# Patient Record
Sex: Female | Born: 1980 | Race: Black or African American | Hispanic: No | Marital: Single | Smoking: Never smoker
Health system: Southern US, Community
[De-identification: ages and names within clinical notes are randomized; demographics above are authoritative.]

## PROBLEM LIST (undated history)

## (undated) DIAGNOSIS — K219 Gastro-esophageal reflux disease without esophagitis: Secondary | ICD-10-CM

## (undated) DIAGNOSIS — K2970 Gastritis, unspecified, without bleeding: Secondary | ICD-10-CM

---
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
## 1998-02-13 ENCOUNTER — Inpatient Hospital Stay (HOSPITAL_COMMUNITY): Admission: AD | Admit: 1998-02-13 | Discharge: 1998-02-13 | Payer: Self-pay | Admitting: Obstetrics

## 1998-05-23 ENCOUNTER — Encounter: Admission: RE | Admit: 1998-05-23 | Discharge: 1998-05-23 | Payer: Self-pay | Admitting: Family Medicine

## 1998-05-26 ENCOUNTER — Emergency Department (HOSPITAL_COMMUNITY): Admission: EM | Admit: 1998-05-26 | Discharge: 1998-05-26 | Payer: Self-pay | Admitting: Emergency Medicine

## 1998-05-26 ENCOUNTER — Encounter: Payer: Self-pay | Admitting: Emergency Medicine

## 1998-05-29 ENCOUNTER — Encounter: Admission: RE | Admit: 1998-05-29 | Discharge: 1998-05-29 | Payer: Self-pay | Admitting: Family Medicine

## 1998-09-23 ENCOUNTER — Encounter: Admission: RE | Admit: 1998-09-23 | Discharge: 1998-09-23 | Payer: Self-pay | Admitting: Family Medicine

## 1998-10-02 ENCOUNTER — Encounter: Admission: RE | Admit: 1998-10-02 | Discharge: 1998-10-02 | Payer: Self-pay | Admitting: Family Medicine

## 1999-01-07 ENCOUNTER — Encounter: Admission: RE | Admit: 1999-01-07 | Discharge: 1999-01-07 | Payer: Self-pay | Admitting: Family Medicine

## 1999-04-18 ENCOUNTER — Encounter: Admission: RE | Admit: 1999-04-18 | Discharge: 1999-04-18 | Payer: Self-pay | Admitting: Family Medicine

## 1999-04-22 ENCOUNTER — Encounter: Admission: RE | Admit: 1999-04-22 | Discharge: 1999-04-22 | Payer: Self-pay | Admitting: Sports Medicine

## 1999-05-01 ENCOUNTER — Encounter: Admission: RE | Admit: 1999-05-01 | Discharge: 1999-05-01 | Payer: Self-pay | Admitting: Family Medicine

## 1999-05-02 ENCOUNTER — Encounter: Admission: RE | Admit: 1999-05-02 | Discharge: 1999-05-02 | Payer: Self-pay | Admitting: Family Medicine

## 1999-05-02 ENCOUNTER — Ambulatory Visit (HOSPITAL_COMMUNITY): Admission: RE | Admit: 1999-05-02 | Discharge: 1999-05-02 | Payer: Self-pay | Admitting: Family Medicine

## 1999-05-05 ENCOUNTER — Ambulatory Visit (HOSPITAL_COMMUNITY): Admission: RE | Admit: 1999-05-05 | Discharge: 1999-05-05 | Payer: Self-pay | Admitting: Family Medicine

## 1999-05-22 ENCOUNTER — Encounter: Admission: RE | Admit: 1999-05-22 | Discharge: 1999-05-22 | Payer: Self-pay | Admitting: Family Medicine

## 1999-05-30 ENCOUNTER — Encounter: Admission: RE | Admit: 1999-05-30 | Discharge: 1999-05-30 | Payer: Self-pay | Admitting: Family Medicine

## 1999-05-30 ENCOUNTER — Other Ambulatory Visit: Admission: RE | Admit: 1999-05-30 | Discharge: 1999-05-30 | Payer: Self-pay | Admitting: Obstetrics & Gynecology

## 1999-08-02 ENCOUNTER — Emergency Department (HOSPITAL_COMMUNITY): Admission: EM | Admit: 1999-08-02 | Discharge: 1999-08-03 | Payer: Self-pay | Admitting: Emergency Medicine

## 1999-08-03 ENCOUNTER — Encounter: Payer: Self-pay | Admitting: Emergency Medicine

## 1999-09-09 ENCOUNTER — Encounter: Admission: RE | Admit: 1999-09-09 | Discharge: 1999-09-09 | Payer: Self-pay | Admitting: Sports Medicine

## 1999-10-14 ENCOUNTER — Encounter: Admission: RE | Admit: 1999-10-14 | Discharge: 1999-10-14 | Payer: Self-pay | Admitting: Family Medicine

## 1999-11-12 ENCOUNTER — Encounter: Admission: RE | Admit: 1999-11-12 | Discharge: 1999-11-12 | Payer: Self-pay | Admitting: Family Medicine

## 1999-11-14 ENCOUNTER — Encounter: Admission: RE | Admit: 1999-11-14 | Discharge: 1999-11-14 | Payer: Self-pay | Admitting: Family Medicine

## 1999-11-19 ENCOUNTER — Emergency Department (HOSPITAL_COMMUNITY): Admission: EM | Admit: 1999-11-19 | Discharge: 1999-11-20 | Payer: Self-pay | Admitting: Internal Medicine

## 1999-11-27 ENCOUNTER — Encounter: Admission: RE | Admit: 1999-11-27 | Discharge: 1999-11-27 | Payer: Self-pay | Admitting: Family Medicine

## 1999-12-24 ENCOUNTER — Encounter: Admission: RE | Admit: 1999-12-24 | Discharge: 1999-12-24 | Payer: Self-pay | Admitting: Family Medicine

## 2000-01-02 ENCOUNTER — Encounter: Admission: RE | Admit: 2000-01-02 | Discharge: 2000-01-02 | Payer: Self-pay | Admitting: Family Medicine

## 2000-03-09 ENCOUNTER — Emergency Department (HOSPITAL_COMMUNITY): Admission: EM | Admit: 2000-03-09 | Discharge: 2000-03-09 | Payer: Self-pay | Admitting: Emergency Medicine

## 2000-03-11 ENCOUNTER — Encounter: Payer: Self-pay | Admitting: Emergency Medicine

## 2000-03-11 ENCOUNTER — Emergency Department (HOSPITAL_COMMUNITY): Admission: EM | Admit: 2000-03-11 | Discharge: 2000-03-11 | Payer: Self-pay | Admitting: Emergency Medicine

## 2000-03-23 ENCOUNTER — Emergency Department (HOSPITAL_COMMUNITY): Admission: EM | Admit: 2000-03-23 | Discharge: 2000-03-23 | Payer: Self-pay | Admitting: Emergency Medicine

## 2000-03-29 ENCOUNTER — Emergency Department (HOSPITAL_COMMUNITY): Admission: EM | Admit: 2000-03-29 | Discharge: 2000-03-29 | Payer: Self-pay | Admitting: Emergency Medicine

## 2000-03-29 ENCOUNTER — Encounter: Payer: Self-pay | Admitting: Emergency Medicine

## 2000-04-01 ENCOUNTER — Ambulatory Visit (HOSPITAL_COMMUNITY): Admission: RE | Admit: 2000-04-01 | Discharge: 2000-04-01 | Payer: Self-pay | Admitting: Family Medicine

## 2000-04-01 ENCOUNTER — Encounter: Admission: RE | Admit: 2000-04-01 | Discharge: 2000-04-01 | Payer: Self-pay | Admitting: Family Medicine

## 2000-04-08 ENCOUNTER — Emergency Department (HOSPITAL_COMMUNITY): Admission: EM | Admit: 2000-04-08 | Discharge: 2000-04-09 | Payer: Self-pay | Admitting: Emergency Medicine

## 2000-04-08 ENCOUNTER — Encounter: Admission: RE | Admit: 2000-04-08 | Discharge: 2000-04-08 | Payer: Self-pay | Admitting: Family Medicine

## 2000-04-09 ENCOUNTER — Encounter: Payer: Self-pay | Admitting: Emergency Medicine

## 2000-04-12 ENCOUNTER — Ambulatory Visit (HOSPITAL_COMMUNITY): Admission: RE | Admit: 2000-04-12 | Discharge: 2000-04-12 | Payer: Self-pay | Admitting: Emergency Medicine

## 2000-04-12 ENCOUNTER — Encounter: Payer: Self-pay | Admitting: Emergency Medicine

## 2000-04-23 ENCOUNTER — Encounter: Admission: RE | Admit: 2000-04-23 | Discharge: 2000-04-23 | Payer: Self-pay | Admitting: Family Medicine

## 2000-04-23 ENCOUNTER — Emergency Department (HOSPITAL_COMMUNITY): Admission: EM | Admit: 2000-04-23 | Discharge: 2000-04-23 | Payer: Self-pay | Admitting: Emergency Medicine

## 2000-05-10 ENCOUNTER — Encounter: Payer: Self-pay | Admitting: Emergency Medicine

## 2000-05-10 ENCOUNTER — Emergency Department (HOSPITAL_COMMUNITY): Admission: EM | Admit: 2000-05-10 | Discharge: 2000-05-10 | Payer: Self-pay | Admitting: Emergency Medicine

## 2000-05-12 ENCOUNTER — Encounter: Admission: RE | Admit: 2000-05-12 | Discharge: 2000-05-12 | Payer: Self-pay | Admitting: Family Medicine

## 2000-06-23 ENCOUNTER — Emergency Department (HOSPITAL_COMMUNITY): Admission: EM | Admit: 2000-06-23 | Discharge: 2000-06-23 | Payer: Self-pay | Admitting: Emergency Medicine

## 2000-08-29 ENCOUNTER — Emergency Department (HOSPITAL_COMMUNITY): Admission: EM | Admit: 2000-08-29 | Discharge: 2000-08-29 | Payer: Self-pay | Admitting: Emergency Medicine

## 2000-09-07 ENCOUNTER — Encounter: Admission: RE | Admit: 2000-09-07 | Discharge: 2000-09-07 | Payer: Self-pay | Admitting: Family Medicine

## 2000-09-24 ENCOUNTER — Encounter: Admission: RE | Admit: 2000-09-24 | Discharge: 2000-09-24 | Payer: Self-pay | Admitting: Family Medicine

## 2000-10-05 ENCOUNTER — Encounter: Admission: RE | Admit: 2000-10-05 | Discharge: 2000-10-05 | Payer: Self-pay | Admitting: Family Medicine

## 2000-11-10 ENCOUNTER — Encounter: Admission: RE | Admit: 2000-11-10 | Discharge: 2000-11-10 | Payer: Self-pay | Admitting: Family Medicine

## 2000-11-17 ENCOUNTER — Encounter: Admission: RE | Admit: 2000-11-17 | Discharge: 2000-11-17 | Payer: Self-pay | Admitting: Family Medicine

## 2000-12-02 ENCOUNTER — Encounter: Admission: RE | Admit: 2000-12-02 | Discharge: 2000-12-02 | Payer: Self-pay | Admitting: Family Medicine

## 2001-01-31 ENCOUNTER — Encounter: Payer: Self-pay | Admitting: Emergency Medicine

## 2001-01-31 ENCOUNTER — Emergency Department (HOSPITAL_COMMUNITY): Admission: EM | Admit: 2001-01-31 | Discharge: 2001-01-31 | Payer: Self-pay | Admitting: Emergency Medicine

## 2001-02-01 ENCOUNTER — Emergency Department (HOSPITAL_COMMUNITY): Admission: EM | Admit: 2001-02-01 | Discharge: 2001-02-01 | Payer: Self-pay | Admitting: Emergency Medicine

## 2001-02-05 ENCOUNTER — Emergency Department (HOSPITAL_COMMUNITY): Admission: EM | Admit: 2001-02-05 | Discharge: 2001-02-05 | Payer: Self-pay | Admitting: Emergency Medicine

## 2001-03-18 ENCOUNTER — Emergency Department (HOSPITAL_COMMUNITY): Admission: EM | Admit: 2001-03-18 | Discharge: 2001-03-18 | Payer: Self-pay | Admitting: Emergency Medicine

## 2001-03-20 ENCOUNTER — Emergency Department (HOSPITAL_COMMUNITY): Admission: EM | Admit: 2001-03-20 | Discharge: 2001-03-20 | Payer: Self-pay | Admitting: Emergency Medicine

## 2001-04-21 ENCOUNTER — Encounter: Admission: RE | Admit: 2001-04-21 | Discharge: 2001-04-21 | Payer: Self-pay | Admitting: Family Medicine

## 2001-04-21 ENCOUNTER — Ambulatory Visit (HOSPITAL_COMMUNITY): Admission: RE | Admit: 2001-04-21 | Discharge: 2001-04-21 | Payer: Self-pay | Admitting: Family Medicine

## 2001-04-28 ENCOUNTER — Encounter: Admission: RE | Admit: 2001-04-28 | Discharge: 2001-04-28 | Payer: Self-pay | Admitting: Family Medicine

## 2001-05-05 ENCOUNTER — Emergency Department (HOSPITAL_COMMUNITY): Admission: EM | Admit: 2001-05-05 | Discharge: 2001-05-05 | Payer: Self-pay | Admitting: Emergency Medicine

## 2001-05-20 ENCOUNTER — Encounter: Admission: RE | Admit: 2001-05-20 | Discharge: 2001-05-20 | Payer: Self-pay | Admitting: Family Medicine

## 2001-07-17 ENCOUNTER — Emergency Department (HOSPITAL_COMMUNITY): Admission: EM | Admit: 2001-07-17 | Discharge: 2001-07-17 | Payer: Self-pay | Admitting: Emergency Medicine

## 2001-07-18 ENCOUNTER — Encounter: Payer: Self-pay | Admitting: Emergency Medicine

## 2001-08-26 ENCOUNTER — Encounter: Admission: RE | Admit: 2001-08-26 | Discharge: 2001-08-26 | Payer: Self-pay | Admitting: Family Medicine

## 2001-09-20 ENCOUNTER — Encounter: Admission: RE | Admit: 2001-09-20 | Discharge: 2001-09-20 | Payer: Self-pay | Admitting: Family Medicine

## 2001-10-10 ENCOUNTER — Emergency Department (HOSPITAL_COMMUNITY): Admission: EM | Admit: 2001-10-10 | Discharge: 2001-10-10 | Payer: Self-pay | Admitting: Emergency Medicine

## 2001-10-19 ENCOUNTER — Encounter: Payer: Self-pay | Admitting: Family Medicine

## 2001-10-19 ENCOUNTER — Encounter: Admission: RE | Admit: 2001-10-19 | Discharge: 2001-10-19 | Payer: Self-pay | Admitting: Family Medicine

## 2004-01-20 ENCOUNTER — Emergency Department (HOSPITAL_COMMUNITY): Admission: EM | Admit: 2004-01-20 | Discharge: 2004-01-20 | Payer: Self-pay | Admitting: Emergency Medicine

## 2004-02-06 ENCOUNTER — Emergency Department (HOSPITAL_COMMUNITY): Admission: EM | Admit: 2004-02-06 | Discharge: 2004-02-06 | Payer: Self-pay | Admitting: Emergency Medicine

## 2004-03-26 ENCOUNTER — Ambulatory Visit: Payer: Self-pay | Admitting: Family Medicine

## 2004-05-28 ENCOUNTER — Ambulatory Visit: Payer: Self-pay | Admitting: Internal Medicine

## 2004-06-03 ENCOUNTER — Ambulatory Visit: Payer: Self-pay | Admitting: Family Medicine

## 2004-06-10 ENCOUNTER — Emergency Department (HOSPITAL_COMMUNITY): Admission: EM | Admit: 2004-06-10 | Discharge: 2004-06-10 | Payer: Self-pay | Admitting: *Deleted

## 2004-06-13 ENCOUNTER — Ambulatory Visit: Payer: Self-pay | Admitting: Family Medicine

## 2004-06-16 ENCOUNTER — Ambulatory Visit: Payer: Self-pay | Admitting: Family Medicine

## 2004-07-03 ENCOUNTER — Ambulatory Visit (HOSPITAL_COMMUNITY): Admission: RE | Admit: 2004-07-03 | Discharge: 2004-07-03 | Payer: Self-pay | Admitting: Internal Medicine

## 2004-10-03 ENCOUNTER — Emergency Department (HOSPITAL_COMMUNITY): Admission: EM | Admit: 2004-10-03 | Discharge: 2004-10-03 | Payer: Self-pay | Admitting: Emergency Medicine

## 2004-12-06 ENCOUNTER — Emergency Department (HOSPITAL_COMMUNITY): Admission: EM | Admit: 2004-12-06 | Discharge: 2004-12-07 | Payer: Self-pay | Admitting: Emergency Medicine

## 2004-12-13 ENCOUNTER — Inpatient Hospital Stay (HOSPITAL_COMMUNITY): Admission: AD | Admit: 2004-12-13 | Discharge: 2004-12-13 | Payer: Self-pay | Admitting: *Deleted

## 2004-12-14 ENCOUNTER — Emergency Department (HOSPITAL_COMMUNITY): Admission: EM | Admit: 2004-12-14 | Discharge: 2004-12-14 | Payer: Self-pay | Admitting: Emergency Medicine

## 2005-02-02 ENCOUNTER — Emergency Department (HOSPITAL_COMMUNITY): Admission: EM | Admit: 2005-02-02 | Discharge: 2005-02-02 | Payer: Self-pay | Admitting: Emergency Medicine

## 2006-01-20 ENCOUNTER — Emergency Department (HOSPITAL_COMMUNITY): Admission: EM | Admit: 2006-01-20 | Discharge: 2006-01-20 | Payer: Self-pay | Admitting: Emergency Medicine

## 2006-03-22 ENCOUNTER — Ambulatory Visit: Payer: Self-pay | Admitting: Family Medicine

## 2006-06-26 ENCOUNTER — Emergency Department (HOSPITAL_COMMUNITY): Admission: EM | Admit: 2006-06-26 | Discharge: 2006-06-26 | Payer: Self-pay | Admitting: Emergency Medicine

## 2006-07-15 ENCOUNTER — Ambulatory Visit: Payer: Self-pay | Admitting: Family Medicine

## 2006-12-14 ENCOUNTER — Ambulatory Visit: Payer: Self-pay | Admitting: Internal Medicine

## 2007-05-03 ENCOUNTER — Ambulatory Visit: Payer: Self-pay | Admitting: Internal Medicine

## 2007-05-03 LAB — CONVERTED CEMR LAB
AST: 17 units/L (ref 0–37)
Alkaline Phosphatase: 55 units/L (ref 39–117)
CO2: 23 meq/L (ref 19–32)
Chlamydia, Swab/Urine, PCR: NEGATIVE
Chloride: 105 meq/L (ref 96–112)
Creatinine, Ser: 0.84 mg/dL (ref 0.40–1.20)
GC Probe Amp, Urine: NEGATIVE
Glucose, Bld: 86 mg/dL (ref 70–99)
Hemoglobin: 12.5 g/dL (ref 12.0–15.0)
Lymphocytes Relative: 43 % (ref 12–46)
Lymphs Abs: 2.2 10*3/uL (ref 0.7–4.0)
Monocytes Absolute: 0.3 10*3/uL (ref 0.1–1.0)
Monocytes Relative: 6 % (ref 3–12)
Neutro Abs: 2.6 10*3/uL (ref 1.7–7.7)
Neutrophils Relative %: 51 % (ref 43–77)
Platelets: 253 10*3/uL (ref 150–400)
Potassium: 3.9 meq/L (ref 3.5–5.3)
Sodium: 140 meq/L (ref 135–145)
Triglycerides: 62 mg/dL (ref <150)
VLDL: 12 mg/dL (ref 0–40)

## 2007-06-03 ENCOUNTER — Encounter: Payer: Self-pay | Admitting: Family Medicine

## 2007-06-03 ENCOUNTER — Ambulatory Visit: Payer: Self-pay | Admitting: Internal Medicine

## 2007-09-22 ENCOUNTER — Emergency Department (HOSPITAL_COMMUNITY): Admission: EM | Admit: 2007-09-22 | Discharge: 2007-09-22 | Payer: Self-pay | Admitting: Emergency Medicine

## 2007-10-23 ENCOUNTER — Emergency Department (HOSPITAL_COMMUNITY): Admission: EM | Admit: 2007-10-23 | Discharge: 2007-10-23 | Payer: Self-pay | Admitting: Emergency Medicine

## 2008-01-23 ENCOUNTER — Encounter (INDEPENDENT_AMBULATORY_CARE_PROVIDER_SITE_OTHER): Payer: Self-pay | Admitting: Family Medicine

## 2008-01-23 ENCOUNTER — Ambulatory Visit: Payer: Self-pay | Admitting: Internal Medicine

## 2008-01-23 LAB — CONVERTED CEMR LAB: GC Probe Amp, Urine: NEGATIVE

## 2008-04-19 ENCOUNTER — Emergency Department (HOSPITAL_COMMUNITY): Admission: EM | Admit: 2008-04-19 | Discharge: 2008-04-19 | Payer: Self-pay | Admitting: Emergency Medicine

## 2008-11-25 IMAGING — CT CT PELVIS W/ CM
2 of 5 series · 16 of 46 positions shown, 18 images · IV contrast (agent unspecified)
Comparison: None

CT ABDOMEN

CLINICAL DATA: Abdominal and pelvic pain, back pain, constipation

CT ABDOMEN AND PELVIS WITH CONTRAST
TECHNIQUE: Multidetector CT imaging of the abdomen and pelvis was
performed using the standard protocol following bolus
administration of intravenous contrast.
Contrast:  100 ml [id number]

[Series 2: abd_pel 5.0 b40s · axial · 0.64mm/px · z∈[+1260,+1600]mm · 13 of 76 slices shown, 15 images]
[im 4/76  soft-tissue]
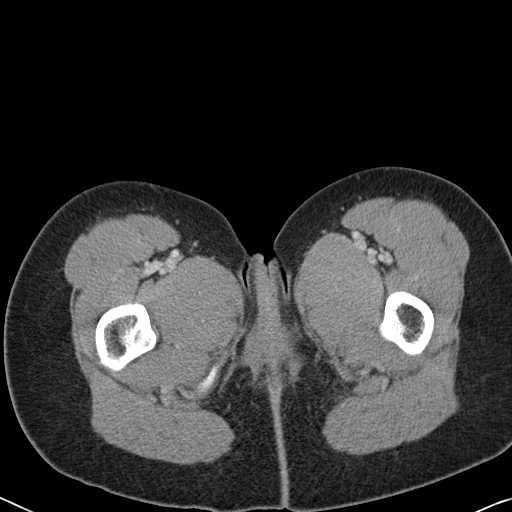
[im 4/76  bone]
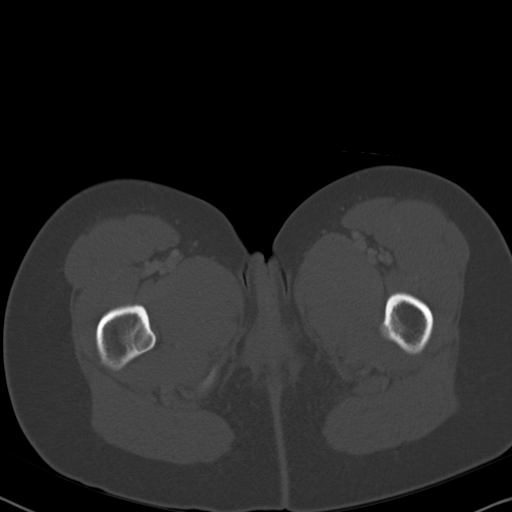
[im 12/76  soft-tissue]
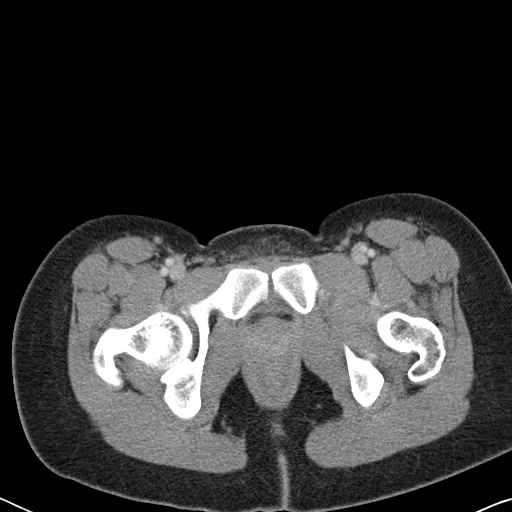
[im 16/76  soft-tissue]
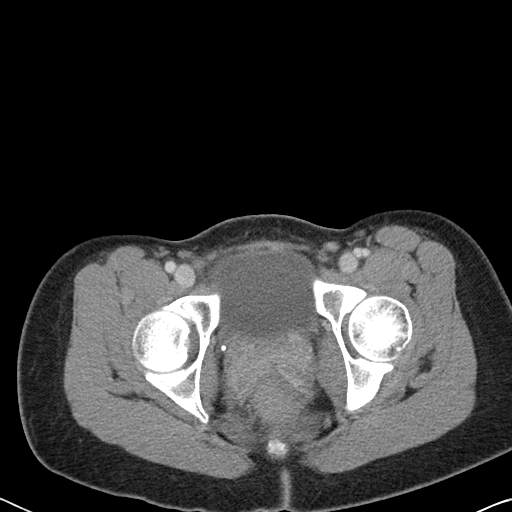
[im 20/76  soft-tissue]
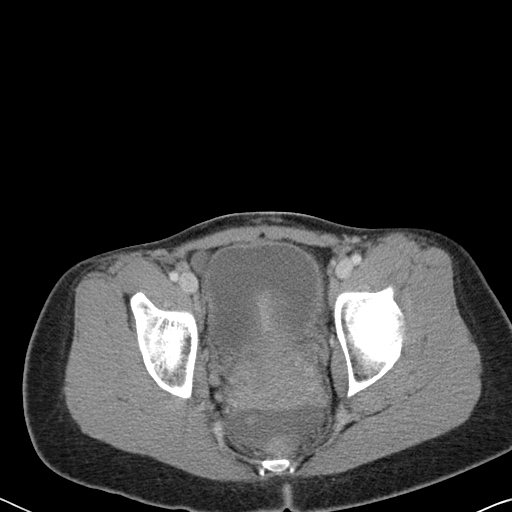
[im 28/76  soft-tissue]
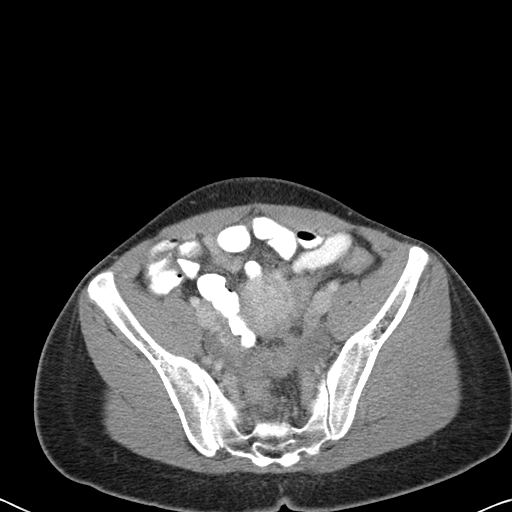
[im 32/76  soft-tissue]
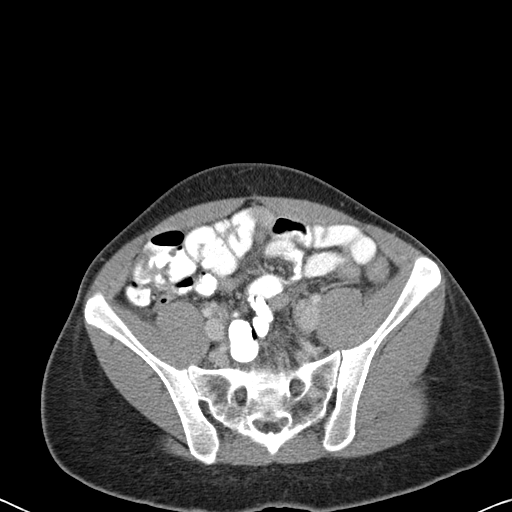
[im 40/76  soft-tissue]
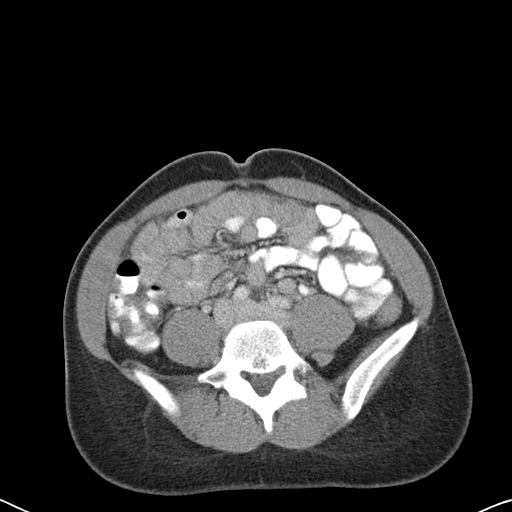
[im 44/76  soft-tissue]
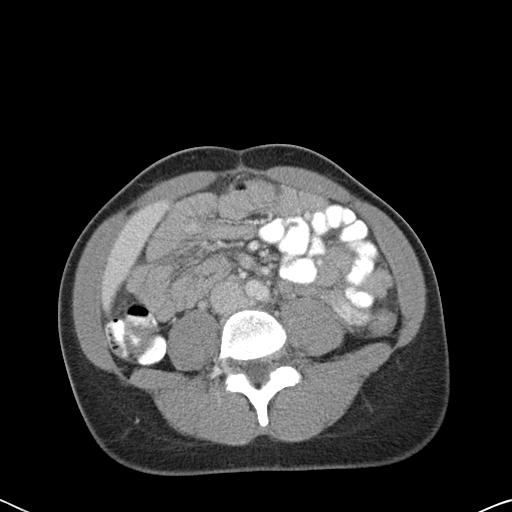
[im 48/76  soft-tissue]
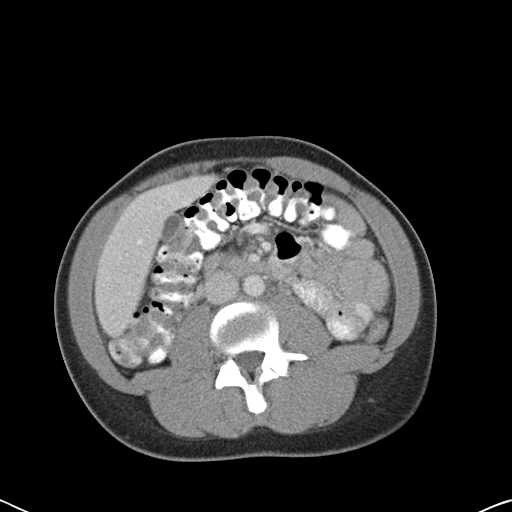
[im 48/76  bone]
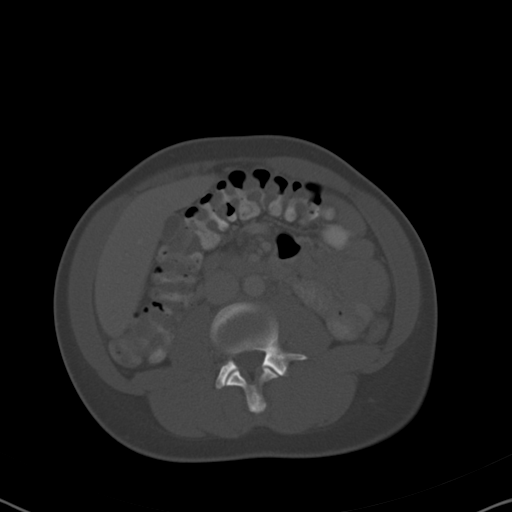
[im 56/76  soft-tissue]
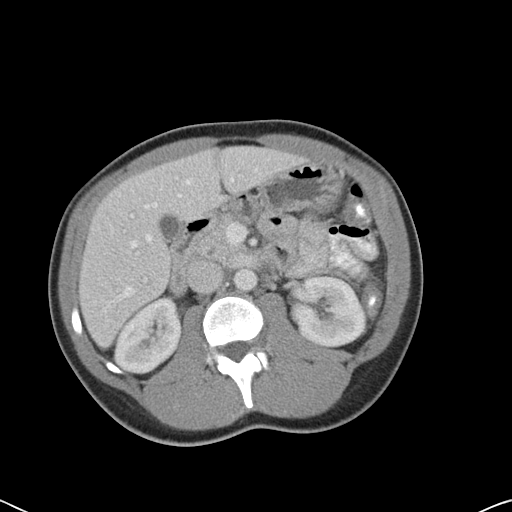
[im 60/76  soft-tissue]
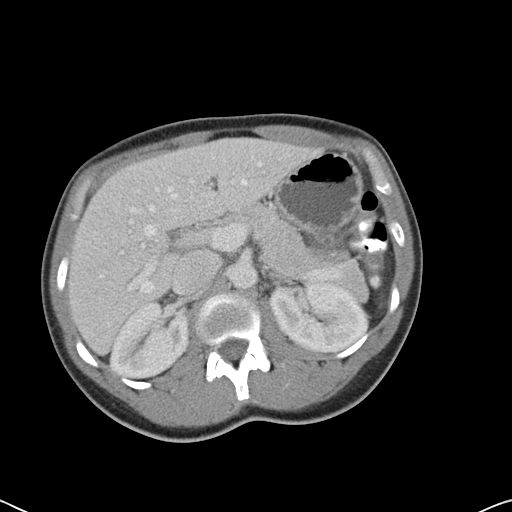
[im 64/76  soft-tissue]
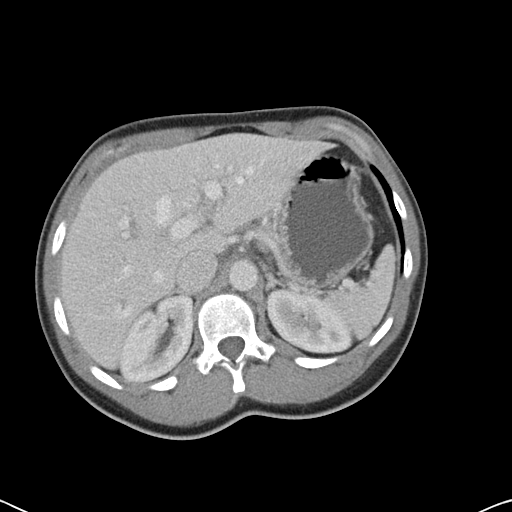
[im 72/76  soft-tissue]
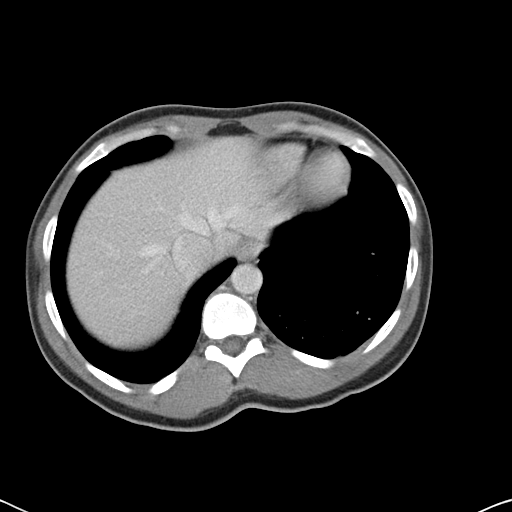

[Series 602: <mpr thick range> · coronal · 0.74mm/px · 3 of 75 slices shown]
[im 25/75  soft-tissue]
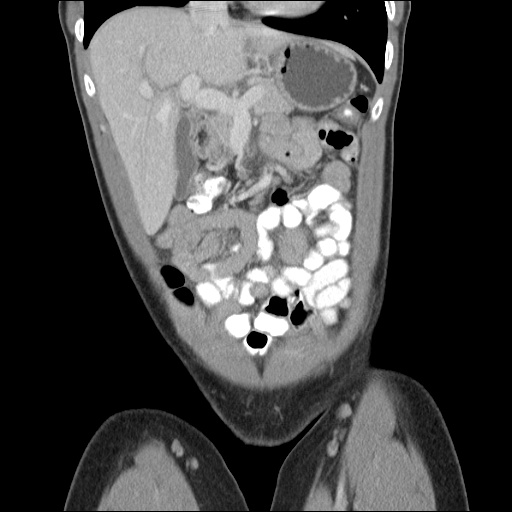
[im 33/75  soft-tissue]
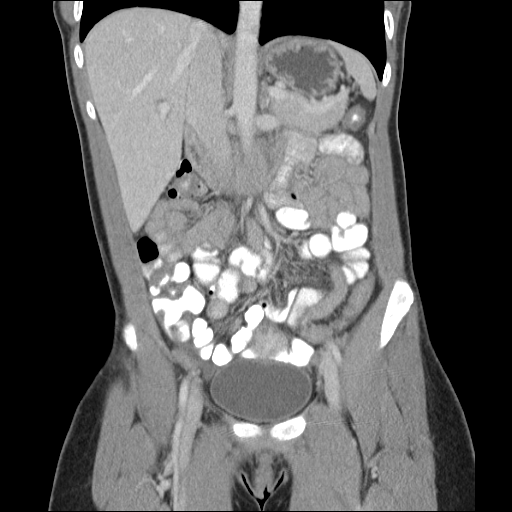
[im 42/75  soft-tissue]
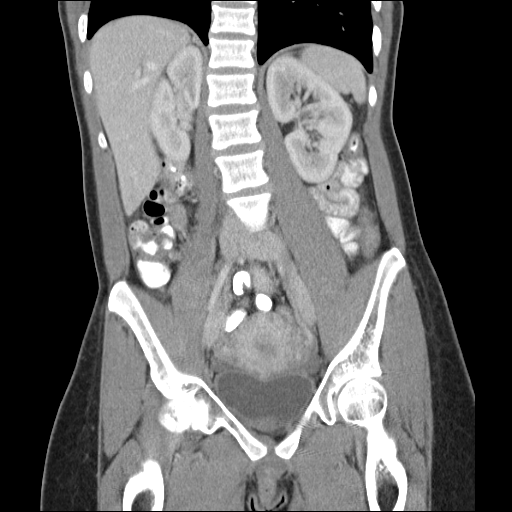

[16 of 46 positions shown; findings below may reference images not displayed]

FINDINGS: The lung bases are clear.  The liver enhances with no
focal abnormality and no ductal dilatation is seen.  No calcified
gallstones are seen.  Gallbladder wall is slightly prominent.
Common bile duct does not appear to be dilated.  The pancreas is
normal in size and the pancreatic duct is not dilated.  The adrenal
glands and spleen appear normal.  The kidneys enhance and on
delayed images the pelvocaliceal systems appear normal.  The
abdominal aorta is normal in caliber.  There is slight mucosal
prominence of the transverse colon and proximal descending colon.
The colon is not distended and this finding is of questionable
significance.  However, colitis cannot be excluded.  A small amount
of fluid layers adjacent to the posterior aspect of the right lobe
of liver.
IMPRESSION: 1.  Some prominence of the mucosa of the transverse colon and
splenic flexure of colon could indicate colitis. Correlate
clinically.  A small amount of fluid is noted posterior to the
right lobe of liver.
2.  Slightly prominent gallbladder wall but no definite gallstones
or ductal dilatation

CT PELVIS
FINDINGS: .  There is a moderate amount of free fluid in the
pelvis.  There appears to be a collapsing right ovarian cyst
present.  The uterus is normal in size and low attenuation within
the endometrial cavity probably is related to the patient's
menstrual cycle.  Soft tissue in the right posterior adnexa near
what appears to be a collapsing cyst may represent ovary, but if
symptoms persist follow-up pelvic ultrasound may be warranted.  The
appendix and the terminal ileum appear normal.The urinary bladder
is unremarkable.
IMPRESSION: 1.  Moderate amount of free fluid in pelvis.  Probable collapsing
right ovarian cyst.

2.  Soft tissue in the right posterior adnexa may represent right
ovary adjacent to collapsing cyst but if symptoms persist follow-up
pelvic ultrasound may be warranted.
3.  Appendix and terminal ileum appear normal.

## 2010-04-06 ENCOUNTER — Encounter: Payer: Self-pay | Admitting: Family Medicine

## 2010-04-06 ENCOUNTER — Encounter: Payer: Self-pay | Admitting: Internal Medicine

## 2010-12-11 LAB — URINALYSIS, ROUTINE W REFLEX MICROSCOPIC
Glucose, UA: NEGATIVE
Ketones, ur: NEGATIVE
Nitrite: NEGATIVE
Protein, ur: NEGATIVE
Urobilinogen, UA: 0.2
pH: 7.5

## 2010-12-11 LAB — CBC
HCT: 32.5 — ABNORMAL LOW
Hemoglobin: 10.9 — ABNORMAL LOW
Platelets: 221
RDW: 13.7
WBC: 8.2

## 2010-12-11 LAB — COMPREHENSIVE METABOLIC PANEL
ALT: 17
Alkaline Phosphatase: 57
Calcium: 9.3
Creatinine, Ser: 0.83
GFR calc non Af Amer: >60
Total Protein: 6.4

## 2010-12-11 LAB — GC/CHLAMYDIA PROBE AMP, GENITAL: Chlamydia, DNA Probe: NEGATIVE

## 2010-12-11 LAB — WET PREP, GENITAL
Trich, Wet Prep: NONE SEEN
Yeast Wet Prep HPF POC: NONE SEEN

## 2010-12-11 LAB — DIFFERENTIAL
Basophils Absolute: 0
Basophils Relative: 0
Monocytes Relative: 7
Neutrophils Relative %: 53

## 2010-12-11 LAB — RPR: RPR Ser Ql: NONREACTIVE

## 2010-12-11 LAB — URINE CULTURE: Colony Count: 3000

## 2010-12-11 LAB — POCT PREGNANCY, URINE: Operator id: 24446

## 2010-12-11 LAB — LIPASE, BLOOD: Lipase: 26

## 2010-12-12 LAB — URINALYSIS, ROUTINE W REFLEX MICROSCOPIC
Glucose, UA: NEGATIVE
Urobilinogen, UA: 0.2
pH: 7.5

## 2010-12-12 LAB — PREGNANCY, URINE: Preg Test, Ur: NEGATIVE

## 2014-07-01 ENCOUNTER — Encounter (HOSPITAL_COMMUNITY): Payer: Self-pay | Admitting: Emergency Medicine

## 2014-07-01 ENCOUNTER — Emergency Department (HOSPITAL_COMMUNITY)
Admission: EM | Admit: 2014-07-01 | Discharge: 2014-07-01 | Disposition: A | Discharge status: Home / Self Care | Payer: Medicaid Other | Attending: Emergency Medicine | Admitting: Emergency Medicine

## 2014-07-01 DIAGNOSIS — K219 Gastro-esophageal reflux disease without esophagitis: Secondary | ICD-10-CM | POA: Diagnosis not present

## 2014-07-01 DIAGNOSIS — Z79899 Other long term (current) drug therapy: Secondary | ICD-10-CM | POA: Diagnosis not present

## 2014-07-01 DIAGNOSIS — O99611 Diseases of the digestive system complicating pregnancy, first trimester: Secondary | ICD-10-CM | POA: Insufficient documentation

## 2014-07-01 DIAGNOSIS — Z3A14 14 weeks gestation of pregnancy: Secondary | ICD-10-CM | POA: Diagnosis not present

## 2014-07-01 DIAGNOSIS — O9989 Other specified diseases and conditions complicating pregnancy, childbirth and the puerperium: Secondary | ICD-10-CM | POA: Diagnosis present

## 2014-07-01 HISTORY — DX: Gastritis, unspecified, without bleeding: K29.70

## 2014-07-01 HISTORY — DX: Gastro-esophageal reflux disease without esophagitis: K21.9

## 2014-07-01 MED ORDER — LIDOCAINE VISCOUS 2 % MT SOLN
15.0000 mL | Freq: Once | OROMUCOSAL | Status: AC
  Administered 2014-07-01: 15 mL via OROMUCOSAL
  Filled 2014-07-01: qty 15

## 2014-07-01 MED ORDER — ALUM & MAG HYDROXIDE-SIMETH 200-200-20 MG/5ML PO SUSP
15.0000 mL | Freq: Once | ORAL | Status: AC
  Administered 2014-07-01: 15 mL via ORAL
  Filled 2014-07-01: qty 30

## 2014-07-01 MED ORDER — LANSOPRAZOLE 30 MG PO CPDR: 30.0000 mg | DELAYED_RELEASE_CAPSULE | Freq: Every day | ORAL | Status: AC

## 2014-07-01 NOTE — ED Notes (Signed)
Bed: [id number] Expected date:  Expected time:  Means of arrival:  Comments: EMS cramping to left side

## 2014-07-01 NOTE — ED Provider Notes (Signed)
CSN: [id number]     Arrival date & time 07/01/14  0014 History   First MD Initiated Contact with Patient 07/01/14 0050     Chief Complaint  Patient presents with  . Left side pain      (Consider location/radiation/quality/duration/timing/severity/associated sxs/prior Treatment) HPI  This is a 34 year old female who is about 3.5 months pregnant. She is here with sudden onset of pain in her left neck, left upper back and left axillary region about 2 hours ago. Pain was moderate to severe earlier but is improving. It is similar to pain she has had with gastric reflux in the past. Symptoms had previously been controlled with Nexium but she stopped taking Nexium when she became pregnant. She took Maalox prior to arrival without relief. It is worse with movement or deep breathing. She denies shortness of breath, nausea or vomiting. She states the pain radiated to her left thigh earlier but not presently.  Past Medical History  Diagnosis Date  . GERD (gastroesophageal reflux disease)   . Gastritis    Past Surgical History  Procedure Laterality Date  . Cesarean section     No family history on file. History  Substance Use Topics  . Smoking status: Never Smoker   . Smokeless tobacco: Not on file  . Alcohol Use: No   OB History    Gravida Para Term Preterm AB TAB SAB Ectopic Multiple Living   1              Review of Systems  All other systems reviewed and are negative.   Allergies  Review of patient's allergies indicates no known allergies.  Home Medications   Prior to Admission medications   Medication Sig Start Date End Date Taking? Authorizing Provider  alum & mag hydroxide-simeth (MAALOX/MYLANTA) 200-200-20 MG/5ML suspension Take 30 mLs by mouth every 6 (six) hours as needed for indigestion or heartburn.   Yes Historical Provider, MD  calcium carbonate (TUMS - DOSED IN MG ELEMENTAL CALCIUM) 500 MG chewable tablet Chew 1-2 tablets by mouth 3 (three) times daily as needed for  indigestion or heartburn.   Yes Historical Provider, MD  Prenatal Vit-Fe Fumarate-FA (PRENATAL MULTIVITAMIN) TABS tablet Take 1 tablet by mouth daily at 12 noon.   Yes Historical Provider, MD   BP 104/68 mmHg  Pulse 88  Resp 18  Ht 5\' 1"  (1.549 m)  Wt 130 lb (58.968 kg)  BMI 24.58 kg/m2  SpO2 96%   Physical Exam  General: Well-developed, well-nourished female in no acute distress; appearance consistent with age of record HENT: normocephalic; atraumatic Eyes: pupils equal, round and reactive to light; extraocular muscles intact Neck: supple Heart: regular rate and rhythm Lungs: clear to auscultation bilaterally Chest: Nontender Abdomen: soft; gravid, consistent with dates; nontender; bowel sounds present Extremities: No deformity; full range of motion; pulses normal; no edema; nontender Neurologic: Awake, alert and oriented; motor function intact in all extremities and symmetric; no facial droop Skin: Warm and dry Psychiatric: Normal mood and affect    ED Course  Procedures (including critical care time)   MDM  1:39 AM Relief with Maalox/lidocaine cocktail (donnatal omitted due to pregnancy). Will start on Prevacid, pregnancy category in B in 2nd and 3rd trimesters.    Paula LibraJohn Felipe Cabell, MD 07/01/14 [phone]

## 2014-07-01 NOTE — ED Notes (Signed)
Per EMS: Pt c/o L side pain from L shoulder, down L side, L hip, and upper L leg onset around 2300. Pt states pain is sharp, onset was sudden while she was shopping. Pt states the pain feels similar to her reflux pain. She took some antacid and had some relief. Pt is 3.5 months pregnant. NSR on monitor.

## 2014-07-01 NOTE — ED Notes (Signed)
Pt reports L shoulder and L side pain, sudden onset around 2300. Worse with movement and with taking deep breaths. Pt states she has had similar pain in the past and was treated with GI cocktail. Pt denies N/V.

## 2016-01-20 ENCOUNTER — Encounter (HOSPITAL_COMMUNITY): Payer: Self-pay | Admitting: *Deleted

## 2016-01-20 ENCOUNTER — Inpatient Hospital Stay (HOSPITAL_COMMUNITY)
Admission: AD | Admit: 2016-01-20 | Discharge: 2016-01-21 | Disposition: A | Discharge status: Home / Self Care | Payer: Medicaid Other | Source: Ambulatory Visit | Attending: Family Medicine | Admitting: Family Medicine

## 2016-01-20 ENCOUNTER — Inpatient Hospital Stay (HOSPITAL_COMMUNITY): Payer: Medicaid Other

## 2016-01-20 DIAGNOSIS — O209 Hemorrhage in early pregnancy, unspecified: Secondary | ICD-10-CM | POA: Diagnosis not present

## 2016-01-20 DIAGNOSIS — R109 Unspecified abdominal pain: Secondary | ICD-10-CM | POA: Diagnosis not present

## 2016-01-20 DIAGNOSIS — O26899 Other specified pregnancy related conditions, unspecified trimester: Secondary | ICD-10-CM | POA: Diagnosis not present

## 2016-01-20 DIAGNOSIS — Z3A00 Weeks of gestation of pregnancy not specified: Secondary | ICD-10-CM | POA: Diagnosis not present

## 2016-01-20 DIAGNOSIS — O3680X0 Pregnancy with inconclusive fetal viability, not applicable or unspecified: Secondary | ICD-10-CM

## 2016-01-20 DIAGNOSIS — N939 Abnormal uterine and vaginal bleeding, unspecified: Secondary | ICD-10-CM | POA: Diagnosis present

## 2016-01-20 LAB — CBC
HCT: 28.6 % — ABNORMAL LOW (ref 36.0–46.0)
Hemoglobin: 9.8 g/dL — ABNORMAL LOW (ref 12.0–15.0)
MCH: 29 pg (ref 26.0–34.0)
MCHC: 34.3 g/dL (ref 30.0–36.0)
MCV: 84.6 fL (ref 78.0–100.0)
PLATELETS: 317 10*3/uL (ref 150–400)
RBC: 3.38 MIL/uL — AB (ref 3.87–5.11)
RDW: 13.4 % (ref 11.5–15.5)
WBC: 7.3 10*3/uL (ref 4.0–10.5)

## 2016-01-20 LAB — URINALYSIS, ROUTINE W REFLEX MICROSCOPIC
Bilirubin Urine: NEGATIVE
GLUCOSE, UA: NEGATIVE mg/dL
HGB URINE DIPSTICK: NEGATIVE
KETONES UR: NEGATIVE mg/dL
Leukocytes, UA: NEGATIVE
Nitrite: NEGATIVE
PROTEIN: NEGATIVE mg/dL
Specific Gravity, Urine: 1.01 (ref 1.005–1.030)
pH: 7 (ref 5.0–8.0)

## 2016-01-20 LAB — HCG, QUANTITATIVE, PREGNANCY: HCG, BETA CHAIN, QUANT, S: 1448 m[IU]/mL — AB (ref <5)

## 2016-01-20 LAB — POCT PREGNANCY, URINE: PREG TEST UR: POSITIVE — AB

## 2016-01-20 NOTE — MAU Note (Signed)
Pt reports she had a positive home preg test 2 days ago and started bleeding and cramping right after that. . Changing a pad q 2 hours, having lower abd cramping.

## 2016-01-20 NOTE — MAU Provider Note (Signed)
History     CSN: [id number]  Arrival date and time: 01/20/16 2022   First Provider Initiated Contact with Patient 01/20/16 2148      Chief Complaint  Patient presents with  . Vaginal Bleeding   Vaginal Bleeding  The patient's primary symptoms include pelvic pain and vaginal bleeding. This is a new problem. The current episode started yesterday. The problem occurs constantly. The problem has been gradually worsening. Pain severity now: 10/10  The problem affects both sides. She is pregnant. Associated symptoms include abdominal pain. Pertinent negatives include no chills, constipation, diarrhea, dysuria, fever, frequency, nausea, urgency or vomiting. The vaginal discharge was bloody. The vaginal bleeding is typical of menses. She has not been passing clots. She has not been passing tissue. Nothing aggravates the symptoms. She has tried acetaminophen for the symptoms. The treatment provided no relief. She is sexually active. She uses nothing for contraception. Menstrual history: LMP: 11/15/15 (approx).      Past Medical History:  Diagnosis Date  . Gastritis   . GERD (gastroesophageal reflux disease)     Past Surgical History:  Procedure Laterality Date  . CESAREAN SECTION      No family history on file.  Social History  Substance Use Topics  . Smoking status: Never Smoker  . Smokeless tobacco: Not on file  . Alcohol use No    Allergies: No Known Allergies  Prescriptions Prior to Admission  Medication Sig Dispense Refill Last Dose  . alum & mag hydroxide-simeth (MAALOX/MYLANTA) 200-200-20 MG/5ML suspension Take 30 mLs by mouth every 6 (six) hours as needed for indigestion or heartburn.   06/30/2014 at Unknown time  . calcium carbonate (TUMS - DOSED IN MG ELEMENTAL CALCIUM) 500 MG chewable tablet Chew 1-2 tablets by mouth 3 (three) times daily as needed for indigestion or heartburn.   Past Week at Unknown time  . lansoprazole (PREVACID) 30 MG capsule Take 1 capsule (30 mg total)  by mouth daily. 30 capsule 0   . Prenatal Vit-Fe Fumarate-FA (PRENATAL MULTIVITAMIN) TABS tablet Take 1 tablet by mouth daily at 12 noon.   06/30/2014 at Unknown time    Review of Systems  Constitutional: Negative for chills and fever.  Gastrointestinal: Positive for abdominal pain. Negative for constipation, diarrhea, nausea and vomiting.  Genitourinary: Positive for pelvic pain and vaginal bleeding. Negative for dysuria, frequency and urgency.   Physical Exam   Blood pressure 115/61, pulse 88, temperature 98.4 F (36.9 C), temperature source Oral, resp. rate 18, last menstrual period 11/15/2015, SpO2 100 %, unknown if currently breastfeeding.  Physical Exam  Nursing note and vitals reviewed. Constitutional: She is oriented to person, place, and time. She appears well-developed and well-nourished. No distress.  HENT:  Head: Normocephalic.  Cardiovascular: Normal rate.   Respiratory: Effort normal.  GI: Soft. There is no tenderness. There is no rebound.  Neurological: She is alert and oriented to person, place, and time.  Skin: Skin is warm and dry.  Psychiatric: She has a normal mood and affect.   Patient refusing pelvic exam   Results for orders placed or performed during the hospital encounter of 01/20/16 (from the past 24 hour(s))  Urinalysis, Routine w reflex microscopic ([facility])     Status: None   Collection Time: 01/20/16  9:27 PM  Result Value Ref Range   Color, Urine YELLOW YELLOW   APPearance CLEAR CLEAR   Specific Gravity, Urine 1.010 1.005 - 1.030   pH 7.0 5.0 - 8.0   Glucose, UA NEGATIVE  NEGATIVE mg/dL   Hgb urine dipstick NEGATIVE NEGATIVE   Bilirubin Urine NEGATIVE NEGATIVE   Ketones, ur NEGATIVE NEGATIVE mg/dL   Protein, ur NEGATIVE NEGATIVE mg/dL   Nitrite NEGATIVE NEGATIVE   Leukocytes, UA NEGATIVE NEGATIVE  Pregnancy, urine POC     Status: Abnormal   Collection Time: 01/20/16  9:48 PM  Result Value Ref Range   Preg Test, Ur POSITIVE (A) NEGATIVE   hCG, quantitative, pregnancy     Status: Abnormal   Collection Time: 01/20/16 10:25 PM  Result Value Ref Range   hCG, Beta Chain, Quant, S 1,448 (H) <5 mIU/mL  CBC     Status: Abnormal   Collection Time: 01/20/16 11:11 PM  Result Value Ref Range   WBC 7.3 4.0 - 10.5 K/uL   RBC 3.38 (L) 3.87 - 5.11 MIL/uL   Hemoglobin 9.8 (L) 12.0 - 15.0 g/dL   HCT 16.128.6 (L) 09.636.0 - 04.546.0 %   MCV 84.6 78.0 - 100.0 fL   MCH 29.0 26.0 - 34.0 pg   MCHC 34.3 30.0 - 36.0 g/dL   RDW 40.913.4 81.111.5 - 91.415.5 %   Platelets 317 150 - 400 K/uL  ABO/Rh     Status: None (Preliminary result)   Collection Time: 01/20/16 11:11 PM  Result Value Ref Range   ABO/RH(D) B POS    [place] Ob Comp Less 14 Wks  Result Date: 01/21/2016 CLINICAL DATA:  Bleeding and cramping for 2 days EXAM: OBSTETRIC <14 WK US AND TRANSVAGINAL OB US TECHNIQUE: Both transabdominal and transvaginal ultrasound examinations were performed for complete evaluation of the gestation as well as the maternal uterus, adnexal regions, and pelvic cul-de-sac. Transvaginal technique was performed to assess early pregnancy. COMPARISON:  None. FINDINGS: Intrauterine gestational sac: Not visualized Yolk sac:  Not visualized Embryo:  Not visualized Cardiac Activity: Not visualized Subchorionic hemorrhage:  None visualized. Maternal uterus/adnexae: No focal myometrial abnormalities. Endometrial stripe is thickened at 1.1 cm. Bilateral ovaries are within normal limits. The left ovary measures 2.3 x 3.4 x 2.5 cm. Right ovary measures 3.7 x 1.8 by 2.5 cm. IMPRESSION: 1. No intrauterine gestational sac, yolk sac or fetal pole is visualized. Endometrial stripe is thickened. Given quantitative beta HCG of less than 2000, early pregnancy is still a possibility. Clinical correlation with serial beta HCG and follow-up ultrasound is recommended. 2. Otherwise negative examination Electronically Signed   By: Jasmine PangKim  Fujinaga M.D.   On: 01/21/2016 00:22   [place] Ob Transvaginal  Result Date:  01/21/2016 CLINICAL DATA:  Bleeding and cramping for 2 days EXAM: OBSTETRIC <14 WK US AND TRANSVAGINAL OB US TECHNIQUE: Both transabdominal and transvaginal ultrasound examinations were performed for complete evaluation of the gestation as well as the maternal uterus, adnexal regions, and pelvic cul-de-sac. Transvaginal technique was performed to assess early pregnancy. COMPARISON:  None. FINDINGS: Intrauterine gestational sac: Not visualized Yolk sac:  Not visualized Embryo:  Not visualized Cardiac Activity: Not visualized Subchorionic hemorrhage:  None visualized. Maternal uterus/adnexae: No focal myometrial abnormalities. Endometrial stripe is thickened at 1.1 cm. Bilateral ovaries are within normal limits. The left ovary measures 2.3 x 3.4 x 2.5 cm. Right ovary measures 3.7 x 1.8 by 2.5 cm. IMPRESSION: 1. No intrauterine gestational sac, yolk sac or fetal pole is visualized. Endometrial stripe is thickened. Given quantitative beta HCG of less than 2000, early pregnancy is still a possibility. Clinical correlation with serial beta HCG and follow-up ultrasound is recommended. 2. Otherwise negative examination Electronically Signed   By: Adrian ProwsKim  Fujinaga M.D.  On: 01/21/2016 00:22    MAU Course  Procedures  MDM Just prior to DC patient states that she want GC/CT testing after refusing pelvic exam earlier. Will have RN collect swabs, and patient can get results at visit for repeat blood work.   Assessment and Plan   1. Pregnancy, location unknown   2. Vaginal bleeding in pregnancy, first trimester    DC home Comfort measures reviewed  1st Trimester precautions  Bleeding precautions Ectopic precautions RX: ibuprofen 800mg  PRN #[id number]  Return to MAU as needed   Follow-up Information    [facility].   Specialty:  Obstetrics and Gynecology Why:  [facility] 01/23/16 AT 9:00 AM FOR REPEAT BLOODWORK Contact information: [address] [place]  [address] [phone]           Tawnya Crook 01/20/2016, 9:50 PM

## 2016-01-21 DIAGNOSIS — O209 Hemorrhage in early pregnancy, unspecified: Secondary | ICD-10-CM

## 2016-01-21 LAB — WET PREP, GENITAL
SPERM: NONE SEEN
Trich, Wet Prep: NONE SEEN
YEAST WET PREP: NONE SEEN

## 2016-01-21 LAB — ABO/RH: ABO/RH(D): B POS

## 2016-01-21 LAB — GC/CHLAMYDIA PROBE AMP (~~LOC~~) NOT AT ARMC
Chlamydia: NEGATIVE
Neisseria Gonorrhea: NEGATIVE

## 2016-01-21 MED ORDER — IBUPROFEN 800 MG PO TABS: 800.0000 mg | ORAL_TABLET | Freq: Three times a day (TID) | ORAL | 0 refills | Status: AC | PRN

## 2016-01-21 MED ORDER — IBUPROFEN 800 MG PO TABS
800.0000 mg | ORAL_TABLET | Freq: Once | ORAL | Status: AC
  Administered 2016-01-21: 800 mg via ORAL
  Filled 2016-01-21: qty 1

## 2016-01-21 NOTE — Discharge Instructions (Signed)

## 2016-01-23 ENCOUNTER — Ambulatory Visit: Payer: Medicaid Other

## 2016-01-24 ENCOUNTER — Encounter: Payer: Self-pay | Admitting: *Deleted

## 2016-01-24 ENCOUNTER — Telehealth: Payer: Self-pay | Admitting: *Deleted

## 2016-01-24 NOTE — Telephone Encounter (Signed)
Pt did not show for stat hcg today. Attempted to call patient but got no answer. Certified letter to patient.

## 2016-02-05 ENCOUNTER — Telehealth: Payer: Self-pay | Admitting: Obstetrics & Gynecology

## 2016-02-05 NOTE — Telephone Encounter (Signed)
Patient called to say she needed to reschedule her appointment she missed. When I informed her about coming in for a blood draw, she declined to come, and said " did y'all refer me to another Dr. I told her I didn't see anything for a referral for another Dr., but we needed her to come in and get her levels checked. She stated she would call back and make an appointment at a later date.

## 2019-09-08 ENCOUNTER — Emergency Department
Admission: EM | Admit: 2019-09-08 | Discharge: 2019-09-08 | Disposition: A | Discharge status: Home / Self Care | Payer: Medicaid Other | Attending: Emergency Medicine | Admitting: Emergency Medicine

## 2019-09-08 ENCOUNTER — Other Ambulatory Visit: Payer: Self-pay

## 2019-09-08 DIAGNOSIS — R1013 Epigastric pain: Secondary | ICD-10-CM | POA: Insufficient documentation

## 2019-09-08 LAB — URINALYSIS, COMPLETE (UACMP) WITH MICROSCOPIC
Bacteria, UA: NONE SEEN
Bilirubin Urine: NEGATIVE
Glucose, UA: NEGATIVE mg/dL
Hgb urine dipstick: NEGATIVE
Ketones, ur: NEGATIVE mg/dL
Leukocytes,Ua: NEGATIVE
Nitrite: NEGATIVE
Protein, ur: NEGATIVE mg/dL
Specific Gravity, Urine: 1.015 (ref 1.005–1.030)
pH: 8 (ref 5.0–8.0)

## 2019-09-08 MED ORDER — LIDOCAINE VISCOUS HCL 2 % MT SOLN
15.0000 mL | Freq: Once | OROMUCOSAL | Status: AC
  Administered 2019-09-08: 15 mL via ORAL
  Filled 2019-09-08: qty 15

## 2019-09-08 MED ORDER — ALUM & MAG HYDROXIDE-SIMETH 200-200-20 MG/5ML PO SUSP
30.0000 mL | Freq: Once | ORAL | Status: AC
  Administered 2019-09-08: 30 mL via ORAL
  Filled 2019-09-08: qty 30

## 2019-09-08 NOTE — ED Notes (Signed)
Pt refused to sign discharge and declined discharge vitals. Pt educated on discharge instructions and verbalized understanding.

## 2019-09-08 NOTE — ED Provider Notes (Signed)
[facility] Emergency Department Provider Note  ____________________________________________  Time seen: Approximately 5:34 AM  I have reviewed the triage vital signs and the nursing notes.   HISTORY  Chief Complaint Abdominal Pain   HPI Erica Avila is a 39 y.o. female a history of gastritis and GERD who presents for evaluation of epigastric abdominal pain.  Patient reports of the last 2 nights she has had epigastric abdominal pain that she describes as burning associated with gas and belching.  Patient reports that both nights the symptoms started after she had fried chicken.  She knows she is not supposed to eat that.  No vomiting, no diarrhea, no dysuria, no hematuria, no chest pain or shortness of breath, no fever or chills.  Patient reports that usually when she gets a GI cocktail her symptoms resolved.  She is here today because of her son would not stop coughing this evening and therefore decided to check herself in for further evaluation.  She denies any prior abdominal surgeries other than a C-section, history of perforated ulcer, melena, hematemesis, hematochezia, coffee-ground emesis, NSAID use.   Past Medical History:  Diagnosis Date   Gastritis    GERD (gastroesophageal reflux disease)      Past Surgical History:  Procedure Laterality Date   CESAREAN SECTION      Prior to Admission medications   Medication Sig Start Date End Date Taking? Authorizing Provider  alum & mag hydroxide-simeth (MAALOX/MYLANTA) 200-200-20 MG/5ML suspension Take 30 mLs by mouth every 6 (six) hours as needed for indigestion or heartburn.    [provider]  calcium carbonate (TUMS - DOSED IN MG ELEMENTAL CALCIUM) 500 MG chewable tablet Chew 1-2 tablets by mouth 3 (three) times daily as needed for indigestion or heartburn.    [provider]  ibuprofen (ADVIL,MOTRIN) 800 MG tablet Take 1 tablet (800 mg total) by mouth every 8 (eight) hours as  needed. 01/21/16   Tresea Mall, CNM  lansoprazole (PREVACID) 30 MG capsule Take 1 capsule (30 mg total) by mouth daily. 07/01/14   Molpus, John, MD  Prenatal Vit-Fe Fumarate-FA (PRENATAL MULTIVITAMIN) TABS tablet Take 1 tablet by mouth daily at 12 noon.    [provider]    Allergies Patient has no known allergies.  No family history on file.  Social History Social History   Tobacco Use   Smoking status: Never Smoker   Smokeless tobacco: Never Used  Substance Use Topics   Alcohol use: No   Drug use: No    Review of Systems  Constitutional: Negative for fever. Eyes: Negative for visual changes. ENT: Negative for sore throat. Neck: No neck pain  Cardiovascular: Negative for chest pain. Respiratory: Negative for shortness of breath. Gastrointestinal: + epigastric burning abdominal pain. no vomiting or diarrhea. Genitourinary: Negative for dysuria. Musculoskeletal: Negative for back pain. Skin: Negative for rash. Neurological: Negative for headaches, weakness or numbness. Psych: No SI or HI  ____________________________________________   PHYSICAL EXAM:  VITAL SIGNS: ED Triage Vitals  Enc Vitals Group     BP 09/08/19 0313 (!) 111/93     Pulse Rate 09/08/19 0313 65     Resp 09/08/19 0313 20     Temp 09/08/19 0313 (!) 97.5 F (36.4 C)     Temp Source 09/08/19 0313 Oral     SpO2 09/08/19 0313 100 %     Weight 09/08/19 0314 130 lb (59 kg)     Height --      Head Circumference --  Peak Flow --      Pain Score 09/08/19 0314 10     Pain Loc --      Pain Edu? --      Excl. in GC? --     Constitutional: Alert and oriented. Well appearing and in no apparent distress. HEENT:      Head: Normocephalic and atraumatic.         Eyes: Conjunctivae are normal. Sclera is non-icteric.       Mouth/Throat: Mucous membranes are moist.       Neck: Supple with no signs of meningismus. Cardiovascular: Regular rate and rhythm. No murmurs, gallops, or rubs.    Respiratory: Normal respiratory effort. Lungs are clear to auscultation bilaterally. No wheezes, crackles, or rhonchi.  Gastrointestinal: Soft, mild epigastric tenderness, and non distended with positive bowel sounds. No rebound or guarding. Genitourinary: No CVA tenderness. Musculoskeletal: No edema, cyanosis, or erythema of extremities. Neurologic: Normal speech and language. Face is symmetric. Moving all extremities. No gross focal neurologic deficits are appreciated. Skin: Skin is warm, dry and intact. No rash noted. Psychiatric: Mood and affect are normal. Speech and behavior are normal.  ____________________________________________   LABS (all labs ordered are listed, but only abnormal results are displayed)  Labs Reviewed  URINALYSIS, COMPLETE (UACMP) WITH MICROSCOPIC - Abnormal; Notable for the following components:      Result Value   Color, Urine YELLOW (*)    APPearance CLEAR (*)    All other components within normal limits  POC URINE PREG, ED   ____________________________________________  EKG  none  ____________________________________________  RADIOLOGY  none  ____________________________________________   PROCEDURES  Procedure(s) performed: None Procedures Critical Care performed:  None ____________________________________________   INITIAL IMPRESSION / ASSESSMENT AND PLAN / ED COURSE   39 y.o. female a history of gastritis and GERD who presents for evaluation of epigastric abdominal pain, belching and gas after eating fried chicken.  Patient is well-appearing in no distress with normal vital signs, abdomen is soft and no distention, positive bowel sounds, minimal tenderness to palpation epigastric region with no rebound or guarding, no lower quadrant tenderness, no right upper quadrant tenderness, negative Murphy sign.  I discussed with the patient the importance of getting blood work, KUB, UA and a pregnancy test to rule out gallbladder pathology versus  pancreatitis versus perforated ulcer versus ectopic pregnancy versus UTI.  Patient provided a urine sample which is negative for UTI and pregnancy test.  Patient is adamant about not undergoing any further tests including blood work and imaging.  She understands that by not doing so we may miss serious surgical diagnosis.  She is requesting a GI cocktail which I will provide to her.  Old medical records reviewed.  Discussed return to the emergency room at any time if she wishes to continue for evaluation.  Otherwise recommended follow-up with her PCP.      _____________________________________________ Please note:  Patient was evaluated in Emergency Department today for the symptoms described in the history of present illness. Patient was evaluated in the context of the global COVID-19 pandemic, which necessitated consideration that the patient might be at risk for infection with the SARS-CoV-2 virus that causes COVID-19. Institutional protocols and algorithms that pertain to the evaluation of patients at risk for COVID-19 are in a state of rapid change based on information released by regulatory bodies including the CDC and federal and state organizations. These policies and algorithms were followed during the patient's care in the ED.  Some ED evaluations  and interventions may be delayed as a result of limited staffing during the pandemic.   [place] Controlled Substance Database was reviewed by me. ____________________________________________   FINAL CLINICAL IMPRESSION(S) / ED DIAGNOSES   Final diagnoses:  Epigastric pain      NEW MEDICATIONS STARTED DURING THIS VISIT:  ED Discharge Orders    None       Note:  This document was prepared using Dragon voice recognition software and may include unintentional dictation errors.    Don Perking, [place], MD 09/08/19 [phone]

## 2019-09-08 NOTE — ED Notes (Addendum)
Pt declined all care except for a "GI cocktail" Dr Don Perking explained that blood work and an abdominal xray were needed to work up abdominal pain but the patient refused and continued to demand the "GI cocktail". Pt declined definitive care against medical advice.

## 2019-09-08 NOTE — ED Triage Notes (Signed)
Pt in with co "bloating", states after eating. States she knows it is her gastritis and does not want any testing done. States here for GI cocktail only.

## 2019-09-08 NOTE — ED Notes (Signed)
Pt. POC was Neg. 

## 2020-08-20 ENCOUNTER — Emergency Department
Admission: EM | Admit: 2020-08-20 | Discharge: 2020-08-20 | Disposition: A | Discharge status: Home / Self Care | Payer: Medicaid Other | Attending: Emergency Medicine | Admitting: Emergency Medicine

## 2020-08-20 ENCOUNTER — Encounter: Payer: Self-pay | Admitting: Emergency Medicine

## 2020-08-20 ENCOUNTER — Other Ambulatory Visit: Payer: Self-pay

## 2020-08-20 DIAGNOSIS — K219 Gastro-esophageal reflux disease without esophagitis: Secondary | ICD-10-CM | POA: Diagnosis not present

## 2020-08-20 DIAGNOSIS — M549 Dorsalgia, unspecified: Secondary | ICD-10-CM | POA: Insufficient documentation

## 2020-08-20 DIAGNOSIS — R1030 Lower abdominal pain, unspecified: Secondary | ICD-10-CM | POA: Diagnosis not present

## 2020-08-20 LAB — CHLAMYDIA/NGC RT PCR (ARMC ONLY)
Chlamydia Tr: NOT DETECTED
N gonorrhoeae: NOT DETECTED

## 2020-08-20 LAB — URINALYSIS, COMPLETE (UACMP) WITH MICROSCOPIC
Bacteria, UA: NONE SEEN
Bilirubin Urine: NEGATIVE
Glucose, UA: NEGATIVE mg/dL
Hgb urine dipstick: NEGATIVE
Ketones, ur: NEGATIVE mg/dL
Leukocytes,Ua: NEGATIVE
Nitrite: NEGATIVE
Protein, ur: NEGATIVE mg/dL
Specific Gravity, Urine: 1.003 — ABNORMAL LOW (ref 1.005–1.030)
pH: 6 (ref 5.0–8.0)

## 2020-08-20 LAB — WET PREP, GENITAL
Clue Cells Wet Prep HPF POC: NONE SEEN
Sperm: NONE SEEN
Trich, Wet Prep: NONE SEEN
Yeast Wet Prep HPF POC: NONE SEEN

## 2020-08-20 LAB — POC URINE PREG, ED: Preg Test, Ur: NEGATIVE

## 2020-08-20 MED ORDER — CEFTRIAXONE SODIUM 250 MG IJ SOLR
250.0000 mg | Freq: Once | INTRAMUSCULAR | Status: AC
  Administered 2020-08-20: 250 mg via INTRAMUSCULAR
  Filled 2020-08-20: qty 250

## 2020-08-20 MED ORDER — AZITHROMYCIN 500 MG PO TABS
1000.0000 mg | ORAL_TABLET | Freq: Once | ORAL | Status: AC
  Administered 2020-08-20: 1000 mg via ORAL
  Filled 2020-08-20: qty 2

## 2020-08-20 MED ORDER — AZITHROMYCIN 500 MG PO TABS: 1000.0000 mg | ORAL_TABLET | Freq: Once | ORAL | 0 refills | Status: AC

## 2020-08-20 MED ORDER — ONDANSETRON 4 MG PO TBDP
4.0000 mg | ORAL_TABLET | Freq: Once | ORAL | Status: AC
  Administered 2020-08-20: 4 mg via ORAL
  Filled 2020-08-20: qty 1

## 2020-08-20 NOTE — ED Notes (Signed)
ED Provider at bedside. 

## 2020-08-20 NOTE — ED Notes (Signed)
Pt went back to her vehicle to check on her minor children per ED tech

## 2020-08-20 NOTE — ED Provider Notes (Signed)
[facility] Emergency Department Provider Note  Time seen: 4:29 PM  I have reviewed the triage vital signs and the nursing notes.   HISTORY  Chief Complaint Abdominal Pain and Back Pain   HPI Erica Avila is a 40 y.o. female with a past medical history of gastritis, gastric reflux, presents to the emergency department for lower abdominal cramping.  According to the patient for the past 3 to 4 days she has been experiencing some lower abdominal cramping.  However in privacy patient states she is here because of a possible STD exposure and wants to be checked.  States mild discharge several days ago but none currently.  No vomiting or diarrhea.  No dysuria but states urinary frequency.  Does state some lower abdominal cramping. Past Medical History:  Diagnosis Date  . Gastritis   . GERD (gastroesophageal reflux disease)     There are no problems to display for this patient.   Past Surgical History:  Procedure Laterality Date  . CESAREAN SECTION      Prior to Admission medications   Medication Sig Start Date End Date Taking? Authorizing Provider  alum & mag hydroxide-simeth (MAALOX/MYLANTA) 200-200-20 MG/5ML suspension Take 30 mLs by mouth every 6 (six) hours as needed for indigestion or heartburn.    [provider]  calcium carbonate (TUMS - DOSED IN MG ELEMENTAL CALCIUM) 500 MG chewable tablet Chew 1-2 tablets by mouth 3 (three) times daily as needed for indigestion or heartburn.    [provider]  ibuprofen (ADVIL,MOTRIN) 800 MG tablet Take 1 tablet (800 mg total) by mouth every 8 (eight) hours as needed. 01/21/16   Armando Reichert, CNM  lansoprazole (PREVACID) 30 MG capsule Take 1 capsule (30 mg total) by mouth daily. 07/01/14   Molpus, John, MD  Prenatal Vit-Fe Fumarate-FA (PRENATAL MULTIVITAMIN) TABS tablet Take 1 tablet by mouth daily at 12 noon.    [provider]    No Known Allergies  No family history on  file.  Social History Social History   Tobacco Use  . Smoking status: Never Smoker  . Smokeless tobacco: Never Used  Substance Use Topics  . Alcohol use: No  . Drug use: No    Review of Systems Constitutional: Negative for fever Cardiovascular: Negative for chest pain. Respiratory: Negative for shortness of breath. Gastrointestinal: Mild lower abdominal cramping. Genitourinary: Mild discharge. Musculoskeletal: Negative for musculoskeletal complaints Neurological: Negative for headache All other ROS negative  ____________________________________________   PHYSICAL EXAM:  VITAL SIGNS: ED Triage Vitals  Enc Vitals Group     BP 08/20/20 1619 100/71     Pulse Rate 08/20/20 1619 98     Resp 08/20/20 1619 16     Temp 08/20/20 1619 98.4 F (36.9 C)     Temp Source 08/20/20 1619 Oral     SpO2 08/20/20 1619 100 %     Weight 08/20/20 1617 130 lb 1.1 oz (59 kg)     Height 08/20/20 1617 5\' 1"  (1.549 m)     Head Circumference --      Peak Flow --      Pain Score 08/20/20 1616 9     Pain Loc --      Pain Edu? --      Excl. in [place]? --    Constitutional: Alert and oriented. Well appearing and in no distress. Eyes: Normal exam ENT      Head: Normocephalic and atraumatic.      Mouth/Throat: Mucous membranes are  moist. Cardiovascular: Normal rate, regular rhythm.  Respiratory: Normal respiratory effort without tachypnea nor retractions. Breath sounds are clear  Gastrointestinal: Soft and nontender. No distention.  Benign abdominal exam. Musculoskeletal: Nontender with normal range of motion in all extremities.  Neurologic:  Normal speech and language. No gross focal neurologic deficits Skin:  Skin is warm, dry and intact.  Psychiatric: Mood and affect are normal.   ____________________________________________   INITIAL IMPRESSION / ASSESSMENT AND PLAN / ED COURSE  Pertinent labs & imaging results that were available during my care of the patient were reviewed by me and  considered in my medical decision making (see chart for details).   Patient presents emergency department for lower abdominal discomfort/cramping.  Recommended we check labs urine and STD check.  Patient is refusing blood work states she is here to rule out an STD.  We will run a GC/chlamydia as well as wet prep.  Patient is agreeable to urinalysis.  Overall patient appears well benign abdominal exam.  Patient's wet prep is negative.  Pregnancy negative.  UA is negative.  Patient would like to be treated for STDs.  GC and Chlamydia are pending.  We will treat with Rocephin and Zithromax and discharged patient with PCP follow-up.  Patient agreeable.  Erica Avila was evaluated in Emergency Department on 08/20/2020 for the symptoms described in the history of present illness. She was evaluated in the context of the global COVID-19 pandemic, which necessitated consideration that the patient might be at risk for infection with the SARS-CoV-2 virus that causes COVID-19. Institutional protocols and algorithms that pertain to the evaluation of patients at risk for COVID-19 are in a state of rapid change based on information released by regulatory bodies including the CDC and federal and state organizations. These policies and algorithms were followed during the patient's care in the ED.  ____________________________________________   FINAL CLINICAL IMPRESSION(S) / ED DIAGNOSES  Abdominal cramping   Minna Antis, MD 08/20/20 1811

## 2020-08-20 NOTE — ED Triage Notes (Signed)
C/O lower abdominal pain and back pain x 1 day.  C/O some discomfort with urination.

## 2020-08-20 NOTE — ED Notes (Signed)
See triage note, pt c.o lower abd pain with discomfort when urinating. Refusing blood work at this time, Dr Lenard Lance aware.

## 2020-08-20 NOTE — ED Notes (Signed)
Pt c/o nausea, Dr Lenard Lance notified and to bedside

## 2020-08-20 NOTE — ED Notes (Signed)
Received report from Brianna, RN 

## 2020-08-20 NOTE — ED Notes (Signed)
Pt refusing to leave at this time.

## 2020-08-20 NOTE — ED Notes (Signed)
Dr. Paduchowski at bedside.  

## 2022-05-25 ENCOUNTER — Emergency Department: Payer: Medicaid Other

## 2022-05-25 DIAGNOSIS — S93401A Sprain of unspecified ligament of right ankle, initial encounter: Secondary | ICD-10-CM | POA: Insufficient documentation

## 2022-05-25 DIAGNOSIS — X509XXA Other and unspecified overexertion or strenuous movements or postures, initial encounter: Secondary | ICD-10-CM | POA: Diagnosis not present

## 2022-05-25 DIAGNOSIS — S99911A Unspecified injury of right ankle, initial encounter: Secondary | ICD-10-CM | POA: Diagnosis present

## 2022-05-25 NOTE — ED Triage Notes (Signed)
Pt to right foot and ankle after " rolling" it earlier this evening. +CMS to extremity. Slight redness noted to base of great toe.  No edema or deformity noted in triage.

## 2022-05-26 ENCOUNTER — Emergency Department: Payer: Medicaid Other

## 2022-05-26 ENCOUNTER — Emergency Department
Admission: EM | Admit: 2022-05-26 | Discharge: 2022-05-26 | Disposition: A | Discharge status: Home / Self Care | Payer: Medicaid Other | Attending: Emergency Medicine | Admitting: Emergency Medicine

## 2022-05-26 DIAGNOSIS — S93401A Sprain of unspecified ligament of right ankle, initial encounter: Secondary | ICD-10-CM

## 2022-05-26 MED ORDER — IBUPROFEN 400 MG PO TABS
400.0000 mg | ORAL_TABLET | Freq: Once | ORAL | Status: DC
  Filled 2022-05-26: qty 1

## 2022-05-26 NOTE — ED Provider Notes (Signed)
[facility] Provider Note    Event Date/Time   First MD Initiated Contact with Patient 05/26/22 0131     (approximate)   History   Foot Pain   HPI  Erica Avila is a 42 y.o. female with no significant past medical history presents with injury to the right ankle.  Patient was walking on uneven surface and rolled the ankle.  She has had pain since then.  Has had some difficulty ambulating was still able to.  Denies head injury or other injury.  No pain in the knee or hip.     Past Medical History:  Diagnosis Date   Gastritis    GERD (gastroesophageal reflux disease)     There are no problems to display for this patient.    Physical Exam  Triage Vital Signs: ED Triage Vitals  Enc Vitals Group     BP 05/25/22 2235 105/72     Pulse Rate 05/25/22 2235 81     Resp 05/25/22 2235 18     Temp 05/25/22 2235 98 F (36.7 C)     Temp Source 05/25/22 2235 Oral     SpO2 05/25/22 2235 99 %     Weight 05/25/22 2233 131 lb (59.4 kg)     Height 05/25/22 2233 '5\' 1"'$  (1.549 m)     Head Circumference --      Peak Flow --      Pain Score 05/25/22 2233 10     Pain Loc --      Pain Edu? --      Excl. in [place]? --     Most recent vital signs: Vitals:   05/25/22 2235  BP: 105/72  Pulse: 81  Resp: 18  Temp: 98 F (36.7 C)  SpO2: 99%     General: Awake, no distress.  CV:  Good peripheral perfusion.  Resp:  Normal effort.  Abd:  No distention.  Neuro:             Awake, Alert, Oriented x 3  Other:  No significant swelling or deformity of the right foot or ankle.  No tenderness over medial or lateral malleoli, tenderness to palpation over the anterior ankle, able to range, no tenderness over the midfoot or toes 2+ DP pulse No tenderness over the proximal fibula   ED Results / Procedures / Treatments  Labs (all labs ordered are listed, but only abnormal results are displayed) Labs Reviewed - No data to display   EKG     RADIOLOGY I reviewed  and interpreted the x-ray of the right foot which is negative for fracture or dislocation   PROCEDURES:  Critical Care performed: No  Procedures  The patient is on the cardiac monitor to evaluate for evidence of arrhythmia and/or significant heart rate changes.   MEDICATIONS ORDERED IN ED: Medications  ibuprofen (ADVIL) tablet 400 mg (has no administration in time range)     IMPRESSION / MDM / ASSESSMENT AND PLAN / ED COURSE  I reviewed the triage vital signs and the nursing notes.                              Patient's presentation is most consistent with acute, uncomplicated illness.  Differential diagnosis includes, but is not limited to, ankle fracture, ankle sprain, less likely foot or toe fracture  Patient is a 42 year old female presents with inversion injury to the right ankle today.  Has been  ambulatory.  She has no significant swelling or deformity.  No tenderness over the malleoli but she does have tenderness to palpation of the anterior ankle.  No tenderness of the forefoot or toes.  X-ray of the foot was obtained from triage but not the ankle.  Given she is primarily having ankle tenderness will obtain full x-ray of the ankle.  Although my suspicion for fracture is low suspect likely sprain.  Will treat with Motrin.  Recommended RICE for likely ankle sprain. Ankle xray is negative, likely sprain.       FINAL CLINICAL IMPRESSION(S) / ED DIAGNOSES   Final diagnoses:  Sprain of right ankle, unspecified ligament, initial encounter     Rx / DC Orders   ED Discharge Orders     None        Note:  This document was prepared using Dragon voice recognition software and may include unintentional dictation errors.   Rada Hay, MD 05/26/22 [phone]

## 2022-05-26 NOTE — ED Notes (Signed)
Woke pt and two children - discharge instructions and education provided. Pt requesting transportation for self and children.

## 2022-05-26 NOTE — Discharge Instructions (Addendum)
Rest, ice or heat, and elevate the ankle. Take tylenol and motrin.

## 2022-05-26 NOTE — ED Notes (Signed)
PATIENT PROVIDED WITH A TAXI VOUCHER FOR HER AND HER 2 CHILDREN TO GET HOME SAFELY DUE TO NO FAMILY OR RESOURCE AT THIS TIME OF MORNING.

## 2022-11-14 ENCOUNTER — Ambulatory Visit
Admission: EM | Admit: 2022-11-14 | Discharge: 2022-11-14 | Disposition: A | Discharge status: Home / Self Care | Payer: Medicaid Other | Attending: Family Medicine | Admitting: Family Medicine

## 2022-11-14 DIAGNOSIS — N898 Other specified noninflammatory disorders of vagina: Secondary | ICD-10-CM | POA: Diagnosis not present

## 2022-11-14 LAB — POCT URINALYSIS DIP (MANUAL ENTRY)
Bilirubin, UA: NEGATIVE
Blood, UA: NEGATIVE
Glucose, UA: NEGATIVE mg/dL
Ketones, POC UA: NEGATIVE mg/dL
Leukocytes, UA: NEGATIVE
Nitrite, UA: NEGATIVE
Protein Ur, POC: NEGATIVE mg/dL
Spec Grav, UA: 1.02 (ref 1.010–1.025)
Urobilinogen, UA: 0.2 E.U./dL
pH, UA: 7 (ref 5.0–8.0)

## 2022-11-14 LAB — POCT URINE PREGNANCY: Preg Test, Ur: NEGATIVE

## 2022-11-14 NOTE — Discharge Instructions (Addendum)
Your urine test is negative and doesn't show any concerns for a UTI.  Vaginal swab has been sent to the lab if any of those results require treatment our office will contact you.  It is recommended that you activate your MyChart in order to view results more readily.  You can scan the QRS code on your discharge paperwork to activate your MyChart and set up a username and password.

## 2022-11-14 NOTE — ED Triage Notes (Signed)
Triaged by provider  

## 2022-11-14 NOTE — ED Provider Notes (Signed)
Renaldo Fiddler    CSN: [id number] Arrival date & time: 11/14/22  1426      History   Chief Complaint Chief Complaint  Patient presents with   uti    HPI Erica Avila is a 42 y.o. female.   HPI In today with concerns for possible UTI after she developed symptoms of back pain, dysuria, side pain a few days ago.  She reports that she drank cranberry juice and symptoms improved.  He is not having any dysuria or low back pain today.  She is concerned as she recently had intercourse with her ex-spouse however she did use condom.  She reports noticing some white discharge a couple days ago however has not seen any additional vaginal discharge. Currently no nausea, vomiting, or abdominal pain.  Past Medical History:  Diagnosis Date   Gastritis    GERD (gastroesophageal reflux disease)     There are no problems to display for this patient.   Past Surgical History:  Procedure Laterality Date   CESAREAN SECTION      OB History     Gravida  1   Para      Term      Preterm      AB      Living         SAB      IAB      Ectopic      Multiple      Live Births               Home Medications    Prior to Admission medications   Medication Sig Start Date End Date Taking? Authorizing Provider  alum & mag hydroxide-simeth (MAALOX/MYLANTA) 200-200-20 MG/5ML suspension Take 30 mLs by mouth every 6 (six) hours as needed for indigestion or heartburn.    [provider]  calcium carbonate (TUMS - DOSED IN MG ELEMENTAL CALCIUM) 500 MG chewable tablet Chew 1-2 tablets by mouth 3 (three) times daily as needed for indigestion or heartburn.    [provider]  ibuprofen (ADVIL,MOTRIN) 800 MG tablet Take 1 tablet (800 mg total) by mouth every 8 (eight) hours as needed. 01/21/16   Armando Reichert, CNM  lansoprazole (PREVACID) 30 MG capsule Take 1 capsule (30 mg total) by mouth daily. 07/01/14   Molpus, John, MD  Prenatal Vit-Fe Fumarate-FA (PRENATAL  MULTIVITAMIN) TABS tablet Take 1 tablet by mouth daily at 12 noon.    [provider]    Family History No family history on file.  Social History Social History   Tobacco Use   Smoking status: Never   Smokeless tobacco: Never  Substance Use Topics   Alcohol use: No   Drug use: No     Allergies   Patient has no known allergies.   Review of Systems Review of Systems Pertinent negatives listed in HPI  Physical Exam Triage Vital Signs ED Triage Vitals  Encounter Vitals Group     BP      Systolic BP Percentile      Diastolic BP Percentile      Pulse      Resp      Temp      Temp src      SpO2      Weight      Height      Head Circumference      Peak Flow      Pain Score      Pain Loc  Pain Education      Exclude from Growth Chart    No data found.  Updated Vital Signs BP 108/75 (BP Location: Left Arm)   Pulse 66   Temp 98.2 F (36.8 C) (Oral)   Resp 16   SpO2 98%   Visual Acuity Right Eye Distance:   Left Eye Distance:   Bilateral Distance:    Right Eye Near:   Left Eye Near:    Bilateral Near:     Physical Exam Vitals reviewed.  Constitutional:      Appearance: Normal appearance.  HENT:     Head: Normocephalic and atraumatic.  Eyes:     Extraocular Movements: Extraocular movements intact.     Pupils: Pupils are equal, round, and reactive to light.  Cardiovascular:     Rate and Rhythm: Normal rate and regular rhythm.  Pulmonary:     Effort: Pulmonary effort is normal.     Breath sounds: Normal breath sounds.  Musculoskeletal:     Cervical back: Normal range of motion.  Skin:    General: Skin is warm and dry.  Neurological:     General: No focal deficit present.     Mental Status: She is alert.    UC Treatments / Results  Labs (all labs ordered are listed, but only abnormal results are displayed) Labs Reviewed  POCT URINALYSIS DIP (MANUAL ENTRY)  POCT URINE PREGNANCY  CERVICOVAGINAL ANCILLARY ONLY     EKG   Radiology No results found.  Procedures Procedures (including critical care time)  Medications Ordered in UC Medications - No data to display  Initial Impression / Assessment and Plan / UC Course  I have reviewed the triage vital signs and the nursing notes.  Pertinent labs & imaging results that were available during my care of the patient were reviewed by me and considered in my medical decision making (see chart for details).    Vaginal discharge, vaginal cytology pending.  Urine test completed unremarkable no concerns for UTI.  Advised that once cytology results are available we will notify her only if treatment is warranted.  Encouraged to activate MyChart. Final Clinical Impressions(s) / UC Diagnoses   Final diagnoses:  Vaginal discharge     Discharge Instructions      Your urine test is negative and doesn't show any concerns for a UTI.  Vaginal swab has been sent to the lab if any of those results require treatment our office will contact you.  It is recommended that you activate your MyChart in order to view results more readily.  You can scan the QRS code on your discharge paperwork to activate your MyChart and set up a username and password.     ED Prescriptions   None    PDMP not reviewed this encounter.   Bing Neighbors, NP 11/14/22 [phone]

## 2022-11-18 ENCOUNTER — Telehealth: Payer: Self-pay

## 2022-11-18 MED ORDER — FLUCONAZOLE 150 MG PO TABS: 150.0000 mg | ORAL_TABLET | Freq: Every day | ORAL | 0 refills | Status: AC

## 2022-11-18 NOTE — Telephone Encounter (Signed)
Patient contacted clinic for vaginal swab results.  Results discussed with patient and provider. Medications sent into patient's pharmacy of choice.  All questions answered.

## 2022-11-19 LAB — CERVICOVAGINAL ANCILLARY ONLY
Bacterial Vaginitis (gardnerella): NEGATIVE
Candida Glabrata: POSITIVE — AB
Candida Vaginitis: NEGATIVE
Chlamydia: NEGATIVE
Comment: NEGATIVE
Comment: NEGATIVE
Comment: NEGATIVE
Comment: NEGATIVE
Comment: NEGATIVE
Comment: NORMAL
Neisseria Gonorrhea: NEGATIVE
Trichomonas: NEGATIVE

## 2023-12-26 ENCOUNTER — Emergency Department (HOSPITAL_COMMUNITY)
Admission: EM | Admit: 2023-12-26 | Discharge: 2023-12-26 | Disposition: A | Discharge status: Home / Self Care | Attending: Emergency Medicine | Admitting: Emergency Medicine

## 2023-12-26 ENCOUNTER — Emergency Department (HOSPITAL_COMMUNITY)

## 2023-12-26 DIAGNOSIS — S92491A Other fracture of right great toe, initial encounter for closed fracture: Secondary | ICD-10-CM

## 2023-12-26 DIAGNOSIS — M25571 Pain in right ankle and joints of right foot: Secondary | ICD-10-CM | POA: Insufficient documentation

## 2023-12-26 DIAGNOSIS — S81011A Laceration without foreign body, right knee, initial encounter: Secondary | ICD-10-CM | POA: Diagnosis not present

## 2023-12-26 DIAGNOSIS — S92424A Nondisplaced fracture of distal phalanx of right great toe, initial encounter for closed fracture: Secondary | ICD-10-CM | POA: Diagnosis not present

## 2023-12-26 DIAGNOSIS — W01198A Fall on same level from slipping, tripping and stumbling with subsequent striking against other object, initial encounter: Secondary | ICD-10-CM | POA: Diagnosis not present

## 2023-12-26 DIAGNOSIS — S99921A Unspecified injury of right foot, initial encounter: Secondary | ICD-10-CM | POA: Diagnosis present

## 2023-12-26 MED ORDER — LIDOCAINE HCL 2 % IJ SOLN
10.0000 mL | Freq: Once | INTRAMUSCULAR | Status: AC
  Administered 2023-12-26: 200 mg
  Filled 2023-12-26: qty 20

## 2023-12-26 MED ORDER — LIDOCAINE-EPINEPHRINE-TETRACAINE (LET) TOPICAL GEL
3.0000 mL | Freq: Once | TOPICAL | Status: AC
  Administered 2023-12-26: 3 mL via TOPICAL
  Filled 2023-12-26: qty 3

## 2023-12-26 MED ORDER — IBUPROFEN 200 MG PO TABS
600.0000 mg | ORAL_TABLET | Freq: Once | ORAL | Status: AC
  Administered 2023-12-26: 600 mg via ORAL
  Filled 2023-12-26: qty 3

## 2023-12-26 MED ORDER — BACITRACIN ZINC 500 UNIT/GM EX OINT
TOPICAL_OINTMENT | CUTANEOUS | Status: AC
  Filled 2023-12-26: qty 0.9

## 2023-12-26 NOTE — ED Provider Notes (Signed)
 [place] [facility]GENC[facility] DE[facility]MENT AT [facility] Provider Note   CSN: [id number] Arrival date & time: 12/26/23  9197     Patient presents with: Knee Injury   Erica Avila is a 43 y.o. female patient with noncontributory past medical history reports emergency room with complaint of fall.  Patient reports she had a ground-level fall and she tripped falling onto some glass.  She fell directly onto her knee and twisted ankle.  She injured her right knee and ankle in the process.  She does have a abrasion to her knee.  She did not hit her head or lose consciousness.  She is denying any head or neck pain.  No seizure, presyncope or syncopal episode with this.   HPI     Prior to Admission medications   Medication Sig Start Date End Date Taking? Authorizing Provider  alum & mag hydroxide-simeth (MAALOX/MYLANTA) 200-200-20 MG/5ML suspension Take 30 mLs by mouth every 6 (six) hours as needed for indigestion or heartburn.    [provider]  calcium carbonate (TUMS - DOSED IN MG ELEMENTAL CALCIUM) 500 MG chewable tablet Chew 1-2 tablets by mouth 3 (three) times daily as needed for indigestion or heartburn.    [provider]  fluconazole  (DIFLUCAN ) 150 MG tablet Take 1 tablet (150 mg total) by mouth daily. 11/18/22   Defelice, Jeanette, NP  ibuprofen  (ADVIL ,MOTRIN ) 800 MG tablet Take 1 tablet (800 mg total) by mouth every 8 (eight) hours as needed. 01/21/16   Edmundo Powell BIRCH, CNM  lansoprazole  (PREVACID ) 30 MG capsule Take 1 capsule (30 mg total) by mouth daily. 07/01/14   Molpus, John, MD  Prenatal Vit-Fe Fumarate-FA (PRENATAL MULTIVITAMIN) TABS tablet Take 1 tablet by mouth daily at 12 noon.    [provider]    Allergies: Patient has no known allergies.    Review of Systems  Musculoskeletal:  Positive for arthralgias.    Updated Vital Signs BP 103/77 (BP Location: Right Arm)   Pulse 81   Temp 98.1 F (36.7 C) (Oral)   Resp 18   SpO2 99%    Physical Exam Vitals and nursing note reviewed.  Constitutional:      General: She is not in acute distress.    Appearance: She is not toxic-appearing.  HENT:     Head: Normocephalic and atraumatic.  Eyes:     General: No scleral icterus.    Conjunctiva/sclera: Conjunctivae normal.  Cardiovascular:     Rate and Rhythm: Normal rate and regular rhythm.     Pulses: Normal pulses.     Heart sounds: Normal heart sounds.  Pulmonary:     Effort: Pulmonary effort is normal. No respiratory distress.     Breath sounds: Normal breath sounds.  Abdominal:     General: Abdomen is flat. Bowel sounds are normal.     Palpations: Abdomen is soft.     Tenderness: There is no abdominal tenderness.  Skin:    General: Skin is warm and dry.     Findings: No lesion.     Comments: Abrasion to right knee.  Tenderness to palpation over right great toe.  Strong dorsal pedal pulse and sensation intact.  Laceration approximately 4 cm show right knee, second laceration right below approximately 3 cm long.  Neurological:     General: No focal deficit present.     Mental Status: She is alert and oriented to person, place, and time. Mental status is at baseline.     (all labs ordered are  listed, but only abnormal results are displayed) Labs Reviewed - No data to display  EKG: None  Radiology: DG Foot Complete Right Result Date: 12/26/2023 CLINICAL DATA:  Status post fall with right great toe pain EXAM: RIGHT FOOT COMPLETE - 3 VIEW COMPARISON:  None Available. FINDINGS: Nondisplaced oblique fracture through the medial great toe distal phalangeal base with intra-articular extension. No acute dislocation. Joint space remains congruent. IMPRESSION: Nondisplaced oblique, intra-articular fracture through the medial great toe distal phalangeal base. Electronically Signed   By: Limin  Xu M.D.   On: 12/26/2023 09:44   DG Knee Complete 4 Views Right Result Date: 12/26/2023 EXAM: 4 OR MORE VIEW(S) XRAY OF THE  RIGHT KNEE 12/26/2023 09:30:00 AM COMPARISON: None available. CLINICAL HISTORY: injury. Patient fell this morning outside and hit her knee on the ground falling in glass. Pt complained of right great toe, right ankle, and right knee pain. FINDINGS: BONES AND JOINTS: No acute fracture. No focal osseous lesion. No joint dislocation. Trace knee joint effusion. No significant degenerative changes. SOFT TISSUES: Prepatellar soft tissue edema identified. There is a thin linear opacity within the anterolateral soft tissues overlying the lateral femoral condyle measuring roughly 1.7 cm. Correlate for any evidence of retained foreign body. IMPRESSION: 1. No acute fracture or dislocation. 2. Prepatellar soft tissue edema and trace knee joint effusion. 3. Thin linear opacity within the anterolateral soft tissues overlying the lateral femoral condyle measuring approximately 1.7 cm, correlate for any clinical signs suggestive of retained glass fragment. Electronically signed by: Waddell Calk MD 12/26/2023 09:43 AM EDT RP Workstation: [id number]   DG Ankle Complete Right Result Date: 12/26/2023 EXAM: 3 OR MORE VIEW(S) XRAY OF THE RIGHT ANKLE 12/26/2023 09:30:00 AM CLINICAL HISTORY: injury. Patient fell this morning outside and hit her knee on the ground falling in glass. Pt complained of right great toe, right ankle, and right knee pain. COMPARISON: None available. FINDINGS: BONES AND JOINTS: No acute fracture. No focal osseous lesion. No joint dislocation. SOFT TISSUES: Mild soft tissue swelling. IMPRESSION: 1. No acute osseous abnormality. 2. Soft tissue swelling. Electronically signed by: Waddell Calk MD 12/26/2023 09:40 AM EDT RP Workstation: [id number]     .Laceration Repair  Date/Time: 12/26/2023 12:52 PM  Performed by: Shermon Warren SAILOR, PA-C Authorized by: Shermon Warren SAILOR, PA-C   Consent:    Consent obtained:  Verbal   Consent given by:  Patient   Risks, benefits, and alternatives were discussed: yes      Risks discussed:  Infection, need for additional repair, nerve damage, poor wound healing, poor cosmetic result, pain, retained foreign body, tendon damage and vascular damage   Alternatives discussed:  Referral, delayed treatment, no treatment and observation Universal protocol:    Procedure explained and questions answered to patient or proxy's satisfaction: yes     Relevant documents present and verified: yes     Test results available: yes     Imaging studies available: yes     Required blood products, implants, devices, and special equipment available: yes     Site/side marked: yes     Immediately prior to procedure, a time out was called: yes     Patient identity confirmed:  Verbally with patient Anesthesia:    Anesthesia method:  Local infiltration   Local anesthetic:  Lidocaine  2% WITH epi Laceration details:    Location:  Leg   Leg location:  R knee   Length (cm):  4 Skin repair:    Repair method:  Sutures   Suture size:  4-0   Suture material:  Nylon   Suture technique:  Simple interrupted   Number of sutures:  5 Approximation:    Approximation:  Close Repair type:    Repair type:  Simple Post-procedure details:    Dressing:  Open (no dressing)   Procedure completion:  Tolerated .Laceration Repair  Date/Time: 12/26/2023 12:53 PM  Performed by: Shermon Warren SAILOR, PA-C Authorized by: Shermon Warren SAILOR, PA-C   Consent:    Consent obtained:  Verbal   Consent given by:  Patient   Risks, benefits, and alternatives were discussed: yes     Risks discussed:  Infection, need for additional repair, nerve damage, vascular damage, tendon damage, retained foreign body, pain, poor cosmetic result and poor wound healing   Alternatives discussed:  No treatment, delayed treatment, observation and referral Universal protocol:    Procedure explained and questions answered to patient or proxy's satisfaction: yes     Relevant documents present and verified: yes     Test results  available: yes     Imaging studies available: yes     Required blood products, implants, devices, and special equipment available: yes     Site/side marked: yes     Immediately prior to procedure, a time out was called: yes     Patient identity confirmed:  Verbally with patient Anesthesia:    Anesthesia method:  Local infiltration   Local anesthetic:  Lidocaine  2% WITH epi Laceration details:    Location:  Leg   Leg location:  R knee   Length (cm):  4 Pre-procedure details:    Preparation:  Patient was prepped and draped in usual sterile fashion and imaging obtained to evaluate for foreign bodies Treatment:    Area cleansed with:  Povidone-iodine   Amount of cleaning:  Standard   Irrigation method:  Pressure wash   Debridement:  None Skin repair:    Repair method:  Sutures   Suture size:  4-0   Suture material:  Nylon   Number of sutures:  4 Approximation:    Approximation:  Close Repair type:    Repair type:  Simple Post-procedure details:    Dressing:  Open (no dressing)   Procedure completion:  Tolerated    Medications Ordered in the ED  ibuprofen  (ADVIL ) tablet 600 mg (600 mg Oral Given 12/26/23 1047)  lidocaine -EPINEPHrine-tetracaine (LET) topical gel (3 mLs Topical Given 12/26/23 1050)  lidocaine  (XYLOCAINE ) 2 % (with pres) injection 200 mg (200 mg Infiltration Given 12/26/23 1050)    Clinical Course as of 12/26/23 1254  Sun Dec 26, 2023  1010 Tdap UTD, notes within last 2-3 years.  [JB]    Clinical Course User Index [JB] Bekki Tavenner, Warren SAILOR, PA-C                                 Medical Decision Making Amount and/or Complexity of Data Reviewed Radiology: ordered.  Risk OTC drugs. Prescription drug management.   This patient presents to the ED for concern of fall, this involves an extensive number of treatment options, and is a complaint that carries with it a high risk of complications and morbidity.  The differential diagnosis includes intracranial  hemorrhage, subdural/epidural hematoma, vertebral fracture, spinal cord injury, muscle strain, skull fracture, fracture, splenic injury, liver injury, perforated viscus, contusions.   Imaging Studies ordered:  I ordered imaging studies including x-rays of right foot, right knee and right ankle I independently visualized and  interpreted imaging which showed possible retained glass approximately 1.7 cm to the right knee as well as right great toe oblique fracture. On exam I was unable to locate any foreign object.  I was able to visualize the entire extent of the wound and still could not find it.  Suspect this came out on its own or was flushed out with cleaning.  I agree with the radiologist interpretation   Cardiac Monitoring: / EKG:  The patient was maintained on a cardiac monitor.     Problem List / ED Course / Critical interventions / Medication management  Patient presents after having mechanical fall.  She did not hit her head or lose consciousness.  She fell directly onto her right knee and stubbed her toe.  Her x-ray does show fracture of her great toe which is not displaced.  She is neurovascularly intact.  Will place in postop shoe and she will use crutches.  Her x-ray of her right knee shows no fracture.  I repaired her laceration without complication.  She already has updated tetanus shot. I ordered medication including ibuprofen  Reevaluation of the patient after these medicines showed that the patient improved I have reviewed the patients home medicines and have made adjustments as needed. Patient feeling better after treatment in ED.  Appropriate for discharge with outpatient follow-up.  She is given orthopedics for toe fracture.  Follow-up with urgent care primary care for suture removal.  Given return precautions.        Final diagnoses:  Other fracture of right great toe, initial encounter for closed fracture  Cut of skin of right knee    ED Discharge Orders      None          Shermon Warren SAILOR, PA-C 12/26/23 1303    Charlyn Sora, MD 01/05/24 [phone]

## 2023-12-26 NOTE — Progress Notes (Signed)
 Orthopedic Tech Progress Note Patient Details:  Erica Avila 1981/02/21 [id number]  Ortho Devices Type of Ortho Device: Postop shoe/boot, Crutches Ortho Device/Splint Location: RLE Ortho Device/Splint Interventions: Application   Post Interventions Patient Tolerated: Well, Ambulated well  Singleton Hickox E [name] 12/26/2023, 10:42 AM

## 2023-12-26 NOTE — ED Triage Notes (Signed)
 Patient fell this morning outside and hit her knee on the ground falling in glass. Dried blood on knee. Left ankle swelling. Swelling and pain to left big toe.

## 2023-12-26 NOTE — Discharge Instructions (Signed)
 Please use Neosporin or bacitracin daily.  Keep area clean dry and covered.  You can have sutures removed in 10 days.  X-ray shows right great toe fracture.  I would recommend nonweightbearing and using crutches and boot until you follow-up with orthopedics.  Take Tylenol and ibuprofen  as well.  Return to ER with new or worsening symptoms.
